# Patient Record
Sex: Male | Born: 1955 | Race: White | Hispanic: No | Marital: Single | Smoking: Former smoker
Health system: Southern US, Community
[De-identification: ages and names within clinical notes are randomized; demographics above are authoritative.]

## PROBLEM LIST (undated history)

## (undated) DIAGNOSIS — E119 Type 2 diabetes mellitus without complications: Secondary | ICD-10-CM

## (undated) DIAGNOSIS — I4891 Unspecified atrial fibrillation: Secondary | ICD-10-CM

## (undated) DIAGNOSIS — I10 Essential (primary) hypertension: Secondary | ICD-10-CM

## (undated) HISTORY — PX: CATARACT EXTRACTION: SUR2

---

## 2019-11-11 ENCOUNTER — Other Ambulatory Visit: Payer: Self-pay | Admitting: Orthopedic Surgery

## 2019-11-11 ENCOUNTER — Ambulatory Visit
Admission: RE | Admit: 2019-11-11 | Discharge: 2019-11-11 | Disposition: A | Discharge status: Home / Self Care | Payer: Medicare Other | Source: Ambulatory Visit | Attending: Orthopedic Surgery | Admitting: Orthopedic Surgery

## 2019-11-11 DIAGNOSIS — M25562 Pain in left knee: Secondary | ICD-10-CM

## 2021-02-01 IMAGING — MR MR KNEE*L* W/O CM
4 of 6 series · 23 of 40 positions shown · non-contrast
Comparison: None.

CLINICAL DATA: Diffuse knee pain with tightness for 1 year. No
acute injury or prior relevant surgery.

EXAM:
MRI OF THE LEFT KNEE WITHOUT CONTRAST
TECHNIQUE: Multiplanar, multisequence MR imaging of the knee was performed. No
intravenous contrast was administered.

[Series 4: T2 fat-sat · coronal · 4.0mm · 0.59mm/px · 6 of 26 slices shown (1 of 2)]
[im 1/26]
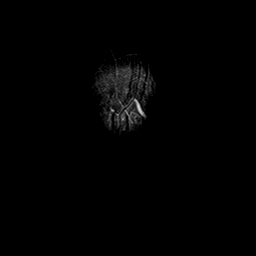
[im 6/26]
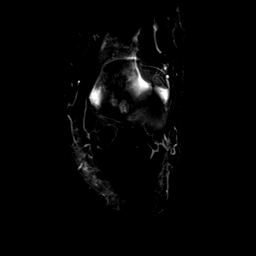
[im 11/26]
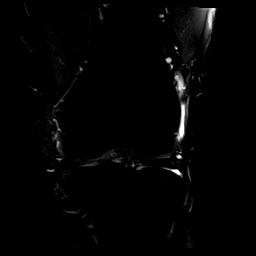
[im 16/26]
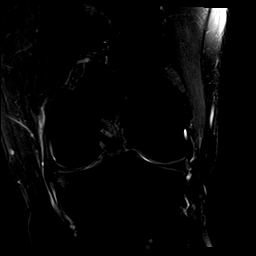
[im 21/26]
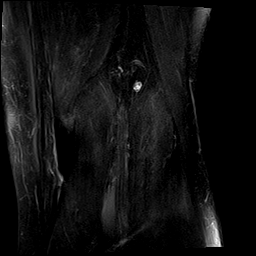
[im 26/26]
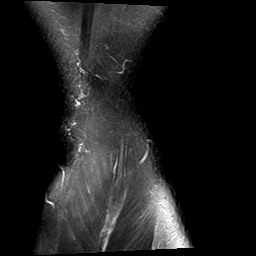

[Series 5: T1 · coronal · 4.0mm · 0.29mm/px · 3 of 26 slices shown]
[im 6/26]
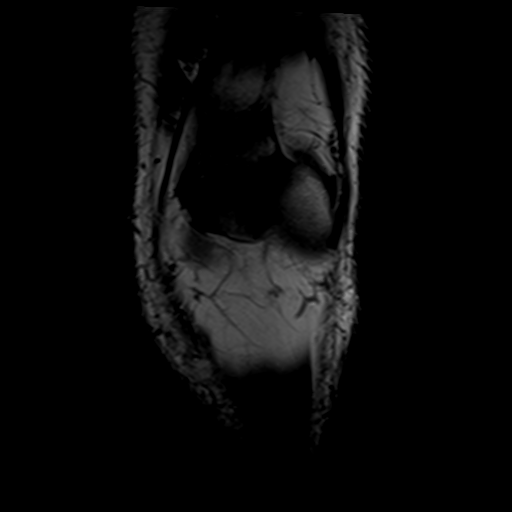
[im 16/26]
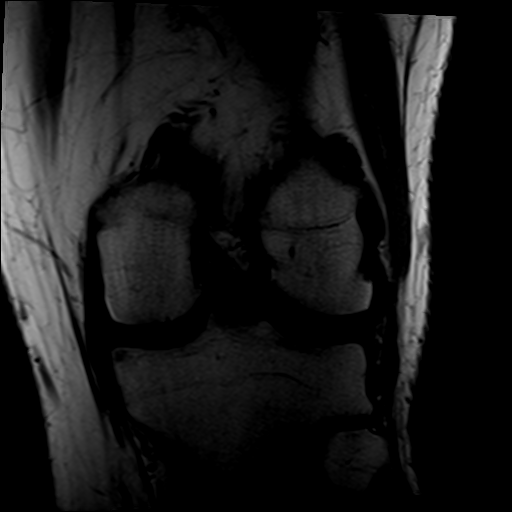
[im 26/26]
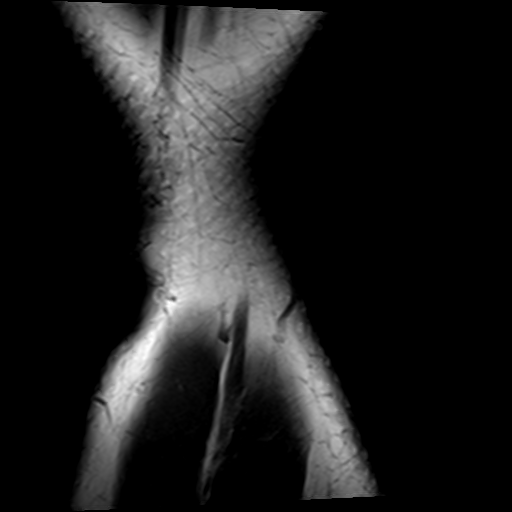

[Series 7: T2 fat-sat · sagittal · 3.0mm · 0.29mm/px · 7 of 30 slices shown (2 of 2)]
[im 1/30]
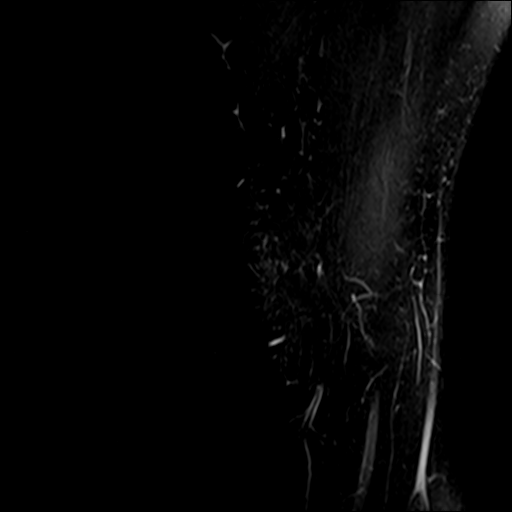
[im 5/30]
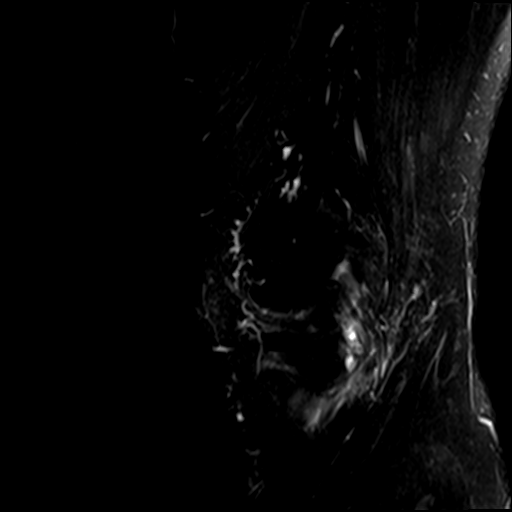
[im 10/30]
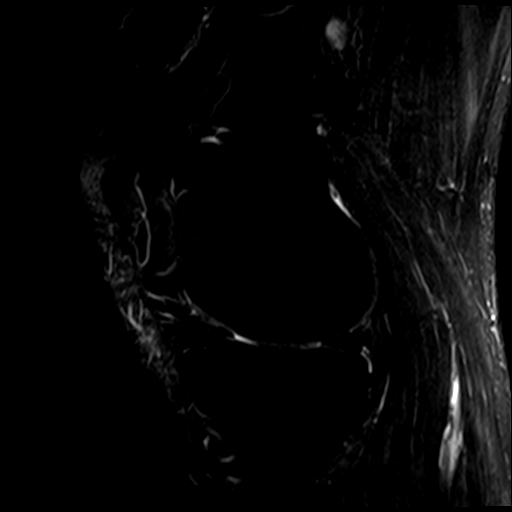
[im 15/30]
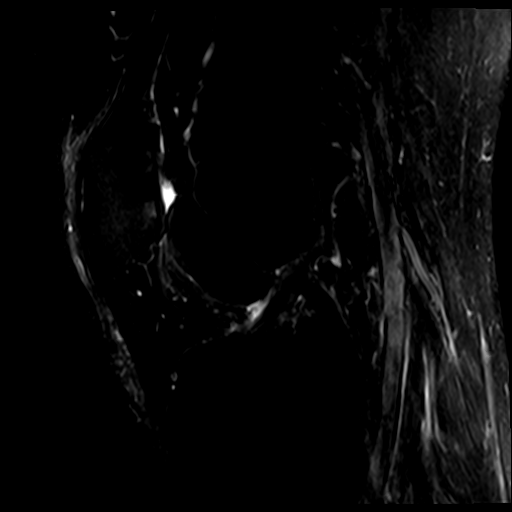
[im 20/30]
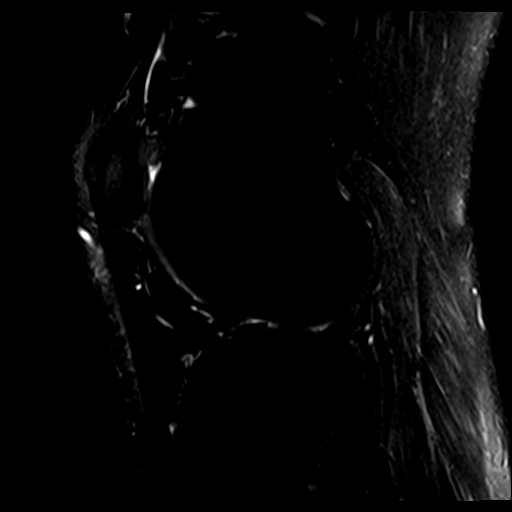
[im 25/30]
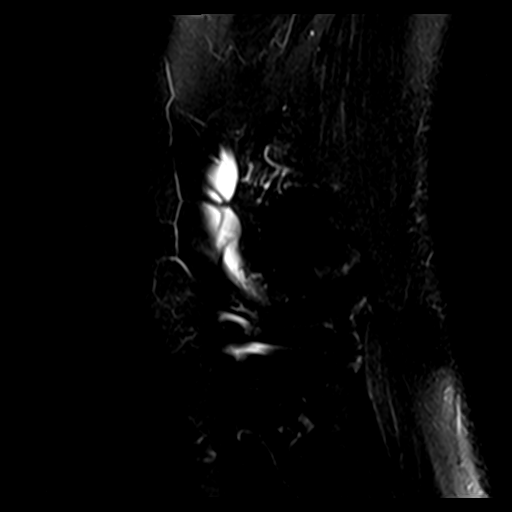
[im 30/30]
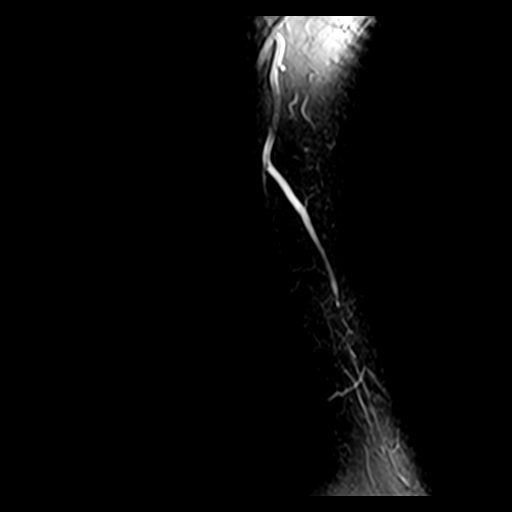

[Series 8: PD fat-sat · sagittal · 3.0mm · 0.29mm/px · 7 of 30 slices shown]
[im 1/30]
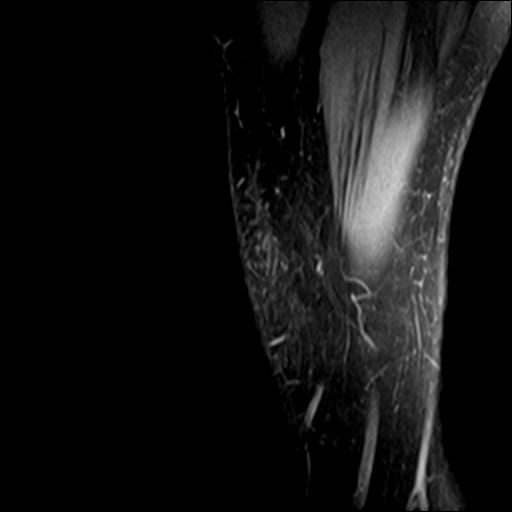
[im 5/30]
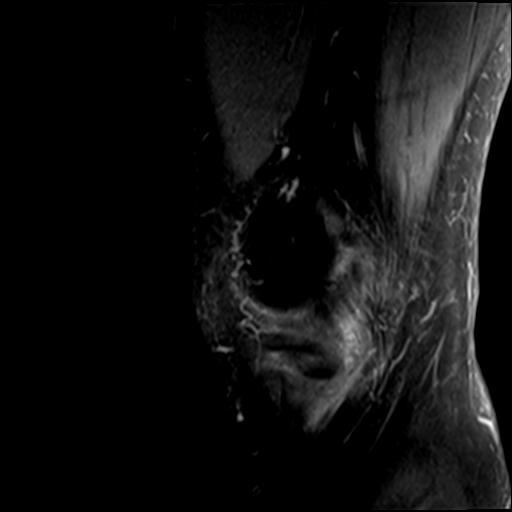
[im 10/30]
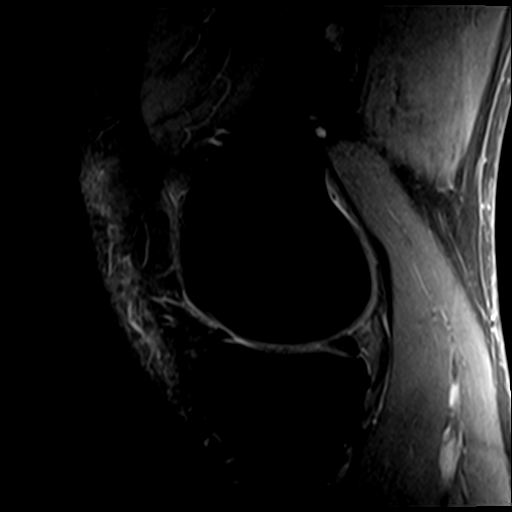
[im 15/30]
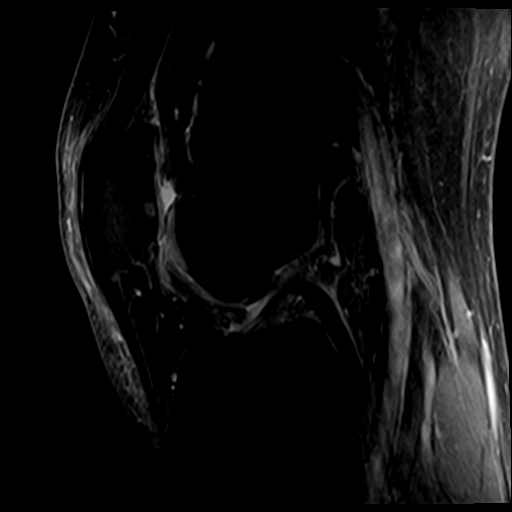
[im 20/30]
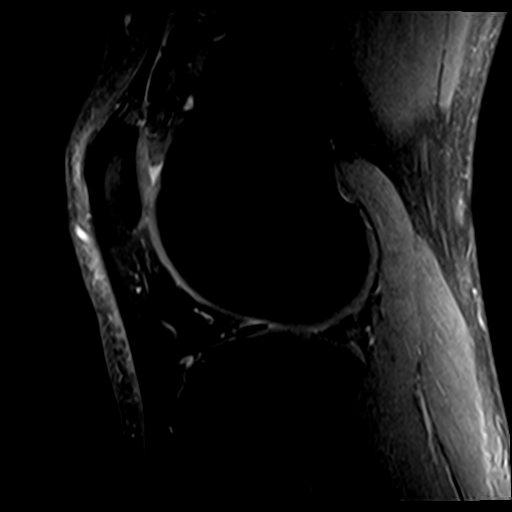
[im 25/30]
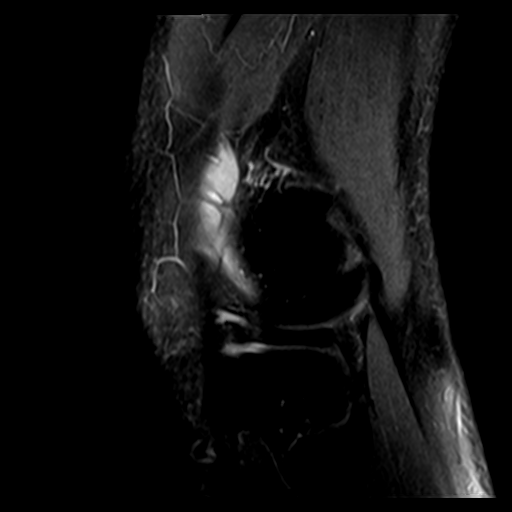
[im 30/30]
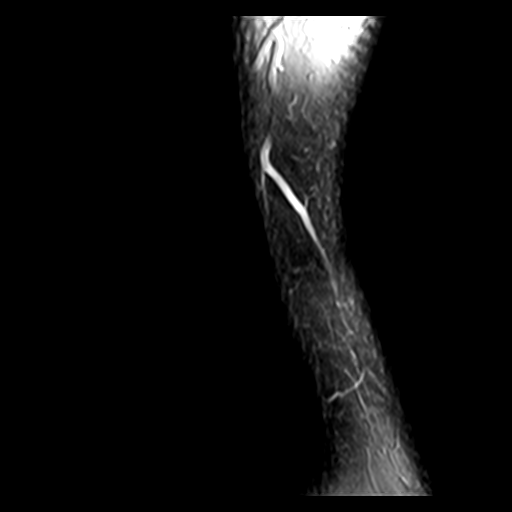

[23 of 40 positions shown; findings below may reference images not displayed]

FINDINGS: MENISCI

Medial meniscus: There is an irregular degenerative tear of the
posterior horn and body. A small flap fragment is peripherally
displaced into the meniscotibial recess, best seen on the coronal
images. The meniscal root is intact, and no centrally displaced
meniscal fragments are seen. There is a septated cyst
posteromedially adjacent to the medial meniscus which measures up to
15 mm on coronal image [DATE], potentially a parameniscal cyst.

Lateral meniscus:  Intact with normal morphology.

LIGAMENTS

Cruciates:  Intact.

Collaterals:  Intact.

CARTILAGE

Patellofemoral: Moderate patellofemoral degenerative changes with
chondral thinning, surface irregularity and subchondral cyst
formation at the patellar apex. The trochlear cartilage is
relatively preserved.

Medial: Mild chondral thinning and surface irregularity without
full-thickness defect. There is mild subchondral cyst formation
peripherally in the medial tibial plateau.

Lateral:  Preserved.

MISCELLANEOUS

Joint:  No significant joint effusion.

Popliteal Fossa:  Unremarkable. No significant Baker's cyst.

Extensor Mechanism:  Intact.

Bones:  No acute or significant extra-articular osseous findings.

Other: No other significant periarticular soft tissue findings.
IMPRESSION: 1. Degenerative tear of the posterior horn and body of the medial
meniscus with small flap fragment peripherally displaced into the
meniscotibial recess.
2. Septated cyst posteromedially adjacent to the medial meniscus,
potentially a parameniscal cyst.
3. Moderate patellofemoral and mild medial compartment degenerative
changes. No acute osseous findings.
4. The lateral meniscus, cruciate and collateral ligaments are
intact.

## 2022-02-05 ENCOUNTER — Encounter (HOSPITAL_COMMUNITY): Payer: Self-pay

## 2022-02-05 ENCOUNTER — Emergency Department (HOSPITAL_BASED_OUTPATIENT_CLINIC_OR_DEPARTMENT_OTHER): Payer: Medicare Other

## 2022-02-05 ENCOUNTER — Encounter (HOSPITAL_BASED_OUTPATIENT_CLINIC_OR_DEPARTMENT_OTHER): Payer: Self-pay | Admitting: Emergency Medicine

## 2022-02-05 ENCOUNTER — Inpatient Hospital Stay (HOSPITAL_BASED_OUTPATIENT_CLINIC_OR_DEPARTMENT_OTHER)
Admission: EM | Admit: 2022-02-05 | Discharge: 2022-02-08 | DRG: 871 | Disposition: A | Discharge status: Home / Self Care | Payer: Medicare Other | Attending: Internal Medicine | Admitting: Internal Medicine

## 2022-02-05 ENCOUNTER — Inpatient Hospital Stay (HOSPITAL_COMMUNITY): Payer: Medicare Other

## 2022-02-05 DIAGNOSIS — G4700 Insomnia, unspecified: Secondary | ICD-10-CM | POA: Diagnosis present

## 2022-02-05 DIAGNOSIS — Z20822 Contact with and (suspected) exposure to covid-19: Secondary | ICD-10-CM | POA: Diagnosis present

## 2022-02-05 DIAGNOSIS — Z7901 Long term (current) use of anticoagulants: Secondary | ICD-10-CM

## 2022-02-05 DIAGNOSIS — A419 Sepsis, unspecified organism: Secondary | ICD-10-CM | POA: Diagnosis present

## 2022-02-05 DIAGNOSIS — J189 Pneumonia, unspecified organism: Secondary | ICD-10-CM | POA: Diagnosis present

## 2022-02-05 DIAGNOSIS — E785 Hyperlipidemia, unspecified: Secondary | ICD-10-CM | POA: Diagnosis present

## 2022-02-05 DIAGNOSIS — Z7984 Long term (current) use of oral hypoglycemic drugs: Secondary | ICD-10-CM | POA: Diagnosis not present

## 2022-02-05 DIAGNOSIS — Z87891 Personal history of nicotine dependence: Secondary | ICD-10-CM | POA: Diagnosis not present

## 2022-02-05 DIAGNOSIS — I480 Paroxysmal atrial fibrillation: Secondary | ICD-10-CM | POA: Diagnosis present

## 2022-02-05 DIAGNOSIS — Z79899 Other long term (current) drug therapy: Secondary | ICD-10-CM | POA: Diagnosis not present

## 2022-02-05 DIAGNOSIS — E119 Type 2 diabetes mellitus without complications: Secondary | ICD-10-CM | POA: Diagnosis present

## 2022-02-05 DIAGNOSIS — E8770 Fluid overload, unspecified: Secondary | ICD-10-CM | POA: Diagnosis present

## 2022-02-05 DIAGNOSIS — J9601 Acute respiratory failure with hypoxia: Secondary | ICD-10-CM | POA: Diagnosis present

## 2022-02-05 DIAGNOSIS — I509 Heart failure, unspecified: Secondary | ICD-10-CM

## 2022-02-05 DIAGNOSIS — I4891 Unspecified atrial fibrillation: Secondary | ICD-10-CM | POA: Diagnosis not present

## 2022-02-05 DIAGNOSIS — Z23 Encounter for immunization: Secondary | ICD-10-CM | POA: Diagnosis present

## 2022-02-05 DIAGNOSIS — I10 Essential (primary) hypertension: Secondary | ICD-10-CM | POA: Diagnosis present

## 2022-02-05 DIAGNOSIS — R0902 Hypoxemia: Secondary | ICD-10-CM | POA: Diagnosis present

## 2022-02-05 HISTORY — DX: Unspecified atrial fibrillation: I48.91

## 2022-02-05 HISTORY — DX: Essential (primary) hypertension: I10

## 2022-02-05 HISTORY — DX: Type 2 diabetes mellitus without complications: E11.9

## 2022-02-05 LAB — ECHOCARDIOGRAM COMPLETE
AR max vel: 2.5 cm2
AV Area VTI: 2.47 cm2
AV Area mean vel: 2.44 cm2
AV Mean grad: 3 mmHg
AV Peak grad: 5.8 mmHg
Ao pk vel: 1.2 m/s
Height: 72 in
S' Lateral: 3.4 cm
Weight: 3280 oz

## 2022-02-05 LAB — GLUCOSE, CAPILLARY
Glucose-Capillary: 165 mg/dL — ABNORMAL HIGH (ref 70–99)
Glucose-Capillary: 174 mg/dL — ABNORMAL HIGH (ref 70–99)
Glucose-Capillary: 193 mg/dL — ABNORMAL HIGH (ref 70–99)
Glucose-Capillary: 196 mg/dL — ABNORMAL HIGH (ref 70–99)

## 2022-02-05 LAB — I-STAT VENOUS BLOOD GAS, ED
Acid-Base Excess: 3 mmol/L — ABNORMAL HIGH (ref 0.0–2.0)
Bicarbonate: 29.7 mmol/L — ABNORMAL HIGH (ref 20.0–28.0)
Calcium, Ion: 1.15 mmol/L (ref 1.15–1.40)
HCT: 43 % (ref 39.0–52.0)
Hemoglobin: 14.6 g/dL (ref 13.0–17.0)
O2 Saturation: 28 %
Patient temperature: 102.6
Potassium: 4 mmol/L (ref 3.5–5.1)
Sodium: 140 mmol/L (ref 135–145)
TCO2: 31 mmol/L (ref 22–32)
pCO2, Ven: 57.1 mmHg (ref 44–60)
pH, Ven: 7.335 (ref 7.25–7.43)
pO2, Ven: 23 mmHg — CL (ref 32–45)

## 2022-02-05 LAB — CBC WITH DIFFERENTIAL/PLATELET
Abs Immature Granulocytes: 0.07 10*3/uL (ref 0.00–0.07)
Basophils Absolute: 0.1 10*3/uL (ref 0.0–0.1)
Basophils Relative: 0 %
Eosinophils Absolute: 0.1 10*3/uL (ref 0.0–0.5)
Eosinophils Relative: 1 %
HCT: 44.4 % (ref 39.0–52.0)
Hemoglobin: 14.8 g/dL (ref 13.0–17.0)
Immature Granulocytes: 0 %
Lymphocytes Relative: 4 %
Lymphs Abs: 0.6 10*3/uL — ABNORMAL LOW (ref 0.7–4.0)
MCH: 31 pg (ref 26.0–34.0)
MCHC: 33.3 g/dL (ref 30.0–36.0)
MCV: 93.1 fL (ref 80.0–100.0)
Monocytes Absolute: 0.8 10*3/uL (ref 0.1–1.0)
Monocytes Relative: 5 %
Neutro Abs: 14.2 10*3/uL — ABNORMAL HIGH (ref 1.7–7.7)
Neutrophils Relative %: 90 %
Platelets: 231 10*3/uL (ref 150–400)
RBC: 4.77 MIL/uL (ref 4.22–5.81)
RDW: 13.6 % (ref 11.5–15.5)
WBC: 15.8 10*3/uL — ABNORMAL HIGH (ref 4.0–10.5)
nRBC: 0 % (ref 0.0–0.2)

## 2022-02-05 LAB — LACTIC ACID, PLASMA: Lactic Acid, Venous: 1.5 mmol/L (ref 0.5–1.9)

## 2022-02-05 LAB — RESPIRATORY PANEL BY PCR

## 2022-02-05 LAB — HIV ANTIBODY (ROUTINE TESTING W REFLEX): HIV Screen 4th Generation wRfx: NONREACTIVE

## 2022-02-05 LAB — BASIC METABOLIC PANEL
Anion gap: 7 (ref 5–15)
BUN: 19 mg/dL (ref 8–23)
CO2: 27 mmol/L (ref 22–32)
Calcium: 8.5 mg/dL — ABNORMAL LOW (ref 8.9–10.3)
Chloride: 106 mmol/L (ref 98–111)
Creatinine, Ser: 1.29 mg/dL — ABNORMAL HIGH (ref 0.61–1.24)
GFR, Estimated: >60 mL/min (ref >60)
Glucose, Bld: 206 mg/dL — ABNORMAL HIGH (ref 70–99)
Potassium: 4.2 mmol/L (ref 3.5–5.1)
Sodium: 140 mmol/L (ref 135–145)

## 2022-02-05 LAB — STREP PNEUMONIAE URINARY ANTIGEN: Strep Pneumo Urinary Antigen: NEGATIVE

## 2022-02-05 LAB — SARS CORONAVIRUS 2 BY RT PCR: SARS Coronavirus 2 by RT PCR: NEGATIVE

## 2022-02-05 LAB — TROPONIN I (HIGH SENSITIVITY)
Troponin I (High Sensitivity): 9 ng/L (ref <18)
Troponin I (High Sensitivity): 17 ng/L (ref <18)
Troponin I (High Sensitivity): 14 ng/L (ref <18)

## 2022-02-05 LAB — D-DIMER, QUANTITATIVE: D-Dimer, Quant: 0.3 ug/mL-FEU (ref 0.00–0.50)

## 2022-02-05 LAB — PROCALCITONIN: Procalcitonin: <0.1 ng/mL

## 2022-02-05 LAB — MRSA NEXT GEN BY PCR, NASAL: MRSA by PCR Next Gen: NOT DETECTED

## 2022-02-05 LAB — BRAIN NATRIURETIC PEPTIDE: B Natriuretic Peptide: 323.1 pg/mL — ABNORMAL HIGH (ref 0.0–100.0)

## 2022-02-05 MED ORDER — SODIUM CHLORIDE 0.9 % IV SOLN
2.0000 g | INTRAVENOUS | Status: DC
  Administered 2022-02-06 – 2022-02-08 (×3): 2 g via INTRAVENOUS
  Filled 2022-02-05 (×3): qty 20

## 2022-02-05 MED ORDER — HEPARIN SODIUM (PORCINE) 5000 UNIT/ML IJ SOLN: 5000.0000 [IU] | Freq: Three times a day (TID) | INTRAMUSCULAR | Status: DC

## 2022-02-05 MED ORDER — POLYETHYLENE GLYCOL 3350 17 G PO PACK: 17.0000 g | PACK | Freq: Every day | ORAL | Status: DC | PRN

## 2022-02-05 MED ORDER — DOCUSATE SODIUM 100 MG PO CAPS: 100.0000 mg | ORAL_CAPSULE | Freq: Two times a day (BID) | ORAL | Status: DC | PRN

## 2022-02-05 MED ORDER — SODIUM CHLORIDE 0.9 % IV SOLN
500.0000 mg | INTRAVENOUS | Status: DC
  Administered 2022-02-06 – 2022-02-08 (×3): 500 mg via INTRAVENOUS
  Filled 2022-02-05 (×3): qty 5

## 2022-02-05 MED ORDER — DILTIAZEM HCL-DEXTROSE 125-5 MG/125ML-% IV SOLN (PREMIX)
INTRAVENOUS | Status: AC
  Administered 2022-02-05: 20 mg via INTRAVENOUS
  Filled 2022-02-05: qty 125

## 2022-02-05 MED ORDER — CARVEDILOL 12.5 MG PO TABS
12.5000 mg | ORAL_TABLET | Freq: Two times a day (BID) | ORAL | Status: DC
  Administered 2022-02-05 – 2022-02-08 (×7): 12.5 mg via ORAL
  Filled 2022-02-05 (×7): qty 1

## 2022-02-05 MED ORDER — ACETAMINOPHEN 325 MG PO TABS
650.0000 mg | ORAL_TABLET | Freq: Four times a day (QID) | ORAL | Status: DC | PRN
  Administered 2022-02-05 – 2022-02-08 (×8): 650 mg via ORAL
  Filled 2022-02-05 (×8): qty 2

## 2022-02-05 MED ORDER — DILTIAZEM HCL-DEXTROSE 125-5 MG/125ML-% IV SOLN (PREMIX)
5.0000 mg/h | INTRAVENOUS | Status: DC
  Administered 2022-02-05: 5 mg/h via INTRAVENOUS

## 2022-02-05 MED ORDER — ATORVASTATIN CALCIUM 40 MG PO TABS
40.0000 mg | ORAL_TABLET | Freq: Every day | ORAL | Status: DC
  Administered 2022-02-05 – 2022-02-08 (×4): 40 mg via ORAL
  Filled 2022-02-05 (×4): qty 1

## 2022-02-05 MED ORDER — ONDANSETRON HCL 4 MG/2ML IJ SOLN
4.0000 mg | Freq: Once | INTRAMUSCULAR | Status: AC
  Administered 2022-02-05: 4 mg via INTRAVENOUS
  Filled 2022-02-05: qty 2

## 2022-02-05 MED ORDER — INSULIN ASPART 100 UNIT/ML IJ SOLN
0.0000 [IU] | Freq: Three times a day (TID) | INTRAMUSCULAR | Status: DC
  Administered 2022-02-05 (×3): 3 [IU] via SUBCUTANEOUS
  Administered 2022-02-06: 2 [IU] via SUBCUTANEOUS
  Administered 2022-02-06 (×3): 3 [IU] via SUBCUTANEOUS
  Administered 2022-02-07 (×2): 2 [IU] via SUBCUTANEOUS
  Administered 2022-02-07 (×2): 3 [IU] via SUBCUTANEOUS
  Administered 2022-02-08: 2 [IU] via SUBCUTANEOUS
  Administered 2022-02-08: 5 [IU] via SUBCUTANEOUS

## 2022-02-05 MED ORDER — APIXABAN 5 MG PO TABS
5.0000 mg | ORAL_TABLET | Freq: Two times a day (BID) | ORAL | Status: DC
  Administered 2022-02-05 – 2022-02-08 (×7): 5 mg via ORAL
  Filled 2022-02-05 (×7): qty 1

## 2022-02-05 MED ORDER — SODIUM CHLORIDE 0.9 % IV SOLN
500.0000 mg | Freq: Once | INTRAVENOUS | Status: AC
  Administered 2022-02-05: 500 mg via INTRAVENOUS
  Filled 2022-02-05: qty 5

## 2022-02-05 MED ORDER — ACETAMINOPHEN 500 MG PO TABS
1000.0000 mg | ORAL_TABLET | Freq: Once | ORAL | Status: AC
  Administered 2022-02-05: 1000 mg via ORAL
  Filled 2022-02-05: qty 2

## 2022-02-05 MED ORDER — GABAPENTIN 300 MG PO CAPS
600.0000 mg | ORAL_CAPSULE | Freq: Every day | ORAL | Status: DC
  Administered 2022-02-05 – 2022-02-07 (×3): 600 mg via ORAL
  Filled 2022-02-05 (×3): qty 2

## 2022-02-05 MED ORDER — ORAL CARE MOUTH RINSE: 15.0000 mL | OROMUCOSAL | Status: DC | PRN

## 2022-02-05 MED ORDER — IBUPROFEN 400 MG PO TABS
400.0000 mg | ORAL_TABLET | Freq: Once | ORAL | Status: AC
  Administered 2022-02-05: 400 mg via ORAL
  Filled 2022-02-05: qty 1

## 2022-02-05 MED ORDER — SODIUM CHLORIDE 0.9 % IV SOLN
1.0000 g | Freq: Once | INTRAVENOUS | Status: AC
  Administered 2022-02-05: 1 g via INTRAVENOUS
  Filled 2022-02-05: qty 10

## 2022-02-05 MED ORDER — DILTIAZEM LOAD VIA INFUSION
20.0000 mg | Freq: Once | INTRAVENOUS | Status: AC
  Filled 2022-02-05: qty 20

## 2022-02-05 MED ORDER — LACTATED RINGERS IV BOLUS
500.0000 mL | Freq: Once | INTRAVENOUS | Status: AC
  Administered 2022-02-05: 500 mL via INTRAVENOUS

## 2022-02-05 MED ORDER — LACTATED RINGERS IV BOLUS
1000.0000 mL | Freq: Once | INTRAVENOUS | Status: AC
  Administered 2022-02-05: 1000 mL via INTRAVENOUS

## 2022-02-05 MED ORDER — ONDANSETRON HCL 4 MG/2ML IJ SOLN: 4.0000 mg | Freq: Four times a day (QID) | INTRAMUSCULAR | Status: DC | PRN

## 2022-02-05 NOTE — ED Notes (Signed)
Attempted to call report, receiving nurse in an isolation room, will call back shortly.

## 2022-02-05 NOTE — ED Notes (Signed)
[facility] at bedside for transport. 

## 2022-02-05 NOTE — ED Notes (Signed)
Patient presented to room 6 with oxygen saturations of 76%. Patient placed on NRB @ 15L. And 15L [place] [facility]. Patient oxygen saturations increased to 88%, RR 40. Patient placed on 30L HHF [place] @ 100%. With a NRB. Patient oxygen saturations increased to 92%. Patient very nervous, shaking. Patient currently on 35L HHF @100 %.  RR 22. Will continue to monitor and make changes as necessary. Patient tolerating well.

## 2022-02-05 NOTE — ED Triage Notes (Signed)
Pt states he started with sore throat and cold sxs yesterday and this morning he feels short of breath  Pt has a cough today and chills

## 2022-02-05 NOTE — ED Provider Notes (Signed)
[facility] Provider Note: Georgena Spurling, MD, FACEP  CSN: [id number] MRN: [id number] ARRIVAL: 02/05/22 at [place]: [facility]   HISTORY OF PRESENT ILLNESS  02/05/22 6:07 AM Peter Robinson is a 66 y.o. male with a history of atrial fibrillation on Eliquis.  He developed a sore throat 2 days ago which resolved yesterday he developed chest congestion.  This morning he developed shortness of breath and cough.  His shortness of breath is moderate to severe and he noted expiratory rhonchorous sounds.  On arrival here he was tachypneic with an oxygen saturation of 76% on room air and respiratory therapy placed him on high flow nasal cannula and his oxygen saturation improved to 90%.  He is noted to be tachycardic with a pulse rate as high as 140, A-fib with RVR on the monitor.  He has not had a fever but does feel chilled.   Past Medical History:  Diagnosis Date   Atrial fibrillation ([place])    Diabetes mellitus without complication ([place])    Hypertension     Past Surgical History:  Procedure Laterality Date   CATARACT EXTRACTION Bilateral     No family history on file.  Social History   Tobacco Use   Smoking status: Former    Types: Cigarettes   Smokeless tobacco: Never  Vaping Use   Vaping Use: Never used  Substance Use Topics   Alcohol use: Yes    Comment: occ   Drug use: Never    Prior to Admission medications   Medication Sig Start Date End Date Taking? Authorizing Provider  amLODipine (NORVASC) 2.5 MG tablet Take 2.5 mg by mouth daily. 12/28/21  Yes [provider]  atorvastatin (LIPITOR) 40 MG tablet Take 40 mg by mouth daily. 11/21/21  Yes [provider]  carvedilol (COREG) 12.5 MG tablet Take 12.5 mg by mouth 2 (two) times daily. 11/21/21  Yes [provider]  cyclobenzaprine (FLEXERIL) 10 MG tablet Take 5-10 mg by mouth 3 (three) times daily as needed for muscle spasms. 10/10/21  Yes  [provider]  doxazosin (CARDURA) 2 MG tablet Take 2 mg by mouth daily. 11/21/21  Yes [provider]  ELIQUIS 5 MG TABS tablet Take 5 mg by mouth 2 (two) times daily. 08/10/21  Yes [provider]  furosemide (LASIX) 20 MG tablet Take 20 mg by mouth daily. 01/23/22  Yes [provider]  gabapentin (NEURONTIN) 300 MG capsule Take 900 mg by mouth 2 (two) times daily. 01/12/22  Yes [provider]  glipiZIDE (GLUCOTROL XL) 10 MG 24 hr tablet Take 20 mg by mouth daily with breakfast. 12/28/21  Yes [provider]  losartan (COZAAR) 100 MG tablet Take 100 mg by mouth daily. 11/21/21  Yes [provider]  metFORMIN (GLUCOPHAGE-XR) 500 MG 24 hr tablet Take 500 mg by mouth daily. 11/30/21  Yes [provider]  predniSONE (DELTASONE) 20 MG tablet Take 20 mg by mouth daily as needed. 01/11/22  Yes [provider]    Allergies Patient has no known allergies.   REVIEW OF SYSTEMS  Negative except as noted here or in the History of Present Illness.   PHYSICAL EXAMINATION  Initial Vital Signs Blood pressure (!) 173/115, pulse (!) 140, temperature 98.1 F (36.7 C), temperature source Oral, resp. rate (!) 24, height 6' (1.829 m), weight 93 kg, SpO2 (!) 76 %.  Examination General: Well-developed, well-nourished male in no acute distress; appearance consistent with  age of record HENT: normocephalic; atraumatic Eyes: Normal appearance Neck: supple Heart: Irregular rhythm; tachycardia; having difficulty speaking in full sentences Lungs: Mild coarse sounds bilaterally; tachypnea Abdomen: soft; nondistended; nontender; bowel sounds present Extremities: No deformity; full range of motion; pulses normal Neurologic: Awake, alert and oriented; motor function intact in all extremities and symmetric; no facial droop Skin: Warm and dry Psychiatric: Anxious   RESULTS  Summary of this visit's results, reviewed and interpreted by  myself:   EKG Interpretation  Date/Time:    Ventricular Rate:    PR Interval:    QRS Duration:   QT Interval:    QTC Calculation:   R Axis:     Text Interpretation:         Laboratory Studies: Results for orders placed or performed during the hospital encounter of 02/05/22 (from the past 24 hour(s))  SARS Coronavirus 2 by RT PCR (hospital order, performed in [facility] hospital lab) *cepheid single result test* Anterior Nasal Swab     Status: None   Collection Time: 02/05/22  6:00 AM   Specimen: Anterior Nasal Swab  Result Value Ref Range   SARS Coronavirus 2 by RT PCR NEGATIVE NEGATIVE  CBC with Differential     Status: Abnormal   Collection Time: 02/05/22  6:15 AM  Result Value Ref Range   WBC 15.8 (H) 4.0 - 10.5 K/uL   RBC 4.77 4.22 - 5.81 MIL/uL   Hemoglobin 14.8 13.0 - 17.0 g/dL   HCT 44.4 39.0 - 52.0 %   MCV 93.1 80.0 - 100.0 fL   MCH 31.0 26.0 - 34.0 pg   MCHC 33.3 30.0 - 36.0 g/dL   RDW 13.6 11.5 - 15.5 %   Platelets 231 150 - 400 K/uL   nRBC 0.0 0.0 - 0.2 %   Neutrophils Relative % 90 %   Neutro Abs 14.2 (H) 1.7 - 7.7 K/uL   Lymphocytes Relative 4 %   Lymphs Abs 0.6 (L) 0.7 - 4.0 K/uL   Monocytes Relative 5 %   Monocytes Absolute 0.8 0.1 - 1.0 K/uL   Eosinophils Relative 1 %   Eosinophils Absolute 0.1 0.0 - 0.5 K/uL   Basophils Relative 0 %   Basophils Absolute 0.1 0.0 - 0.1 K/uL   Immature Granulocytes 0 %   Abs Immature Granulocytes 0.07 0.00 - 0.07 K/uL  Basic metabolic panel     Status: Abnormal   Collection Time: 02/05/22  6:15 AM  Result Value Ref Range   Sodium 140 135 - 145 mmol/L   Potassium 4.2 3.5 - 5.1 mmol/L   Chloride 106 98 - 111 mmol/L   CO2 27 22 - 32 mmol/L   Glucose, Bld 206 (H) 70 - 99 mg/dL   BUN 19 8 - 23 mg/dL   Creatinine, Ser 1.29 (H) 0.61 - 1.24 mg/dL   Calcium 8.5 (L) 8.9 - 10.3 mg/dL   GFR, Estimated >60 >60 mL/min   Anion gap 7 5 - 15  Brain natriuretic peptide     Status: Abnormal   Collection Time: 02/05/22  6:15  AM  Result Value Ref Range   B Natriuretic Peptide 323.1 (H) 0.0 - 100.0 pg/mL  Lactic acid, plasma     Status: None   Collection Time: 02/05/22  6:15 AM  Result Value Ref Range   Lactic Acid, Venous 1.5 0.5 - 1.9 mmol/L  Procalcitonin - Baseline     Status: None   Collection Time: 02/05/22  6:36 AM  Result Value Ref Range  Procalcitonin <0.10 ng/mL  D-dimer, quantitative     Status: None   Collection Time: 02/05/22  6:36 AM  Result Value Ref Range   D-Dimer, Quant 0.30 0.00 - 0.50 ug/mL-FEU  Respiratory (~20 pathogens) panel by PCR     Status: None   Collection Time: 02/05/22  7:06 AM   Specimen: Nasopharyngeal Swab; Respiratory  Result Value Ref Range   Adenovirus NOT DETECTED NOT DETECTED   Coronavirus 229E NOT DETECTED NOT DETECTED   Coronavirus HKU1 NOT DETECTED NOT DETECTED   Coronavirus NL63 NOT DETECTED NOT DETECTED   Coronavirus OC43 NOT DETECTED NOT DETECTED   Metapneumovirus NOT DETECTED NOT DETECTED   Rhinovirus / Enterovirus NOT DETECTED NOT DETECTED   Influenza A NOT DETECTED NOT DETECTED   Influenza B NOT DETECTED NOT DETECTED   Parainfluenza Virus 1 NOT DETECTED NOT DETECTED   Parainfluenza Virus 2 NOT DETECTED NOT DETECTED   Parainfluenza Virus 3 NOT DETECTED NOT DETECTED   Parainfluenza Virus 4 NOT DETECTED NOT DETECTED   Respiratory Syncytial Virus NOT DETECTED NOT DETECTED   Bordetella pertussis NOT DETECTED NOT DETECTED   Bordetella Parapertussis NOT DETECTED NOT DETECTED   Chlamydophila pneumoniae NOT DETECTED NOT DETECTED   Mycoplasma pneumoniae NOT DETECTED NOT DETECTED  I-Stat venous blood gas, ([facility] ED only)     Status: Abnormal   Collection Time: 02/05/22  7:21 AM  Result Value Ref Range   pH, Ven 7.335 7.25 - 7.43   pCO2, Ven 57.1 44 - 60 mmHg   pO2, Ven 23 (LL) 32 - 45 mmHg   Bicarbonate 29.7 (H) 20.0 - 28.0 mmol/L   TCO2 31 22 - 32 mmol/L   O2 Saturation 28 %   Acid-Base Excess 3.0 (H) 0.0 - 2.0 mmol/L   Sodium 140 135 - 145 mmol/L    Potassium 4.0 3.5 - 5.1 mmol/L   Calcium, Ion 1.15 1.15 - 1.40 mmol/L   HCT 43.0 39.0 - 52.0 %   Hemoglobin 14.6 13.0 - 17.0 g/dL   Patient temperature 102.6 F    Collection site IV start    Drawn by Operator    Sample type VENOUS    Comment NOTIFIED PHYSICIAN   Troponin I (High Sensitivity)     Status: None   Collection Time: 02/05/22  8:15 AM  Result Value Ref Range   Troponin I (High Sensitivity) 9 <18 ng/L  MRSA Next Gen by PCR, Nasal     Status: None   Collection Time: 02/05/22 10:11 AM   Specimen: Nasal Mucosa; Nasal Swab  Result Value Ref Range   MRSA by PCR Next Gen NOT DETECTED NOT DETECTED  Glucose, capillary     Status: Abnormal   Collection Time: 02/05/22 11:41 AM  Result Value Ref Range   Glucose-Capillary 165 (H) 70 - 99 mg/dL  HIV Antibody (routine testing w rflx)     Status: None   Collection Time: 02/05/22 11:45 AM  Result Value Ref Range   HIV Screen 4th Generation wRfx Non Reactive Non Reactive  Troponin I (High Sensitivity)     Status: None   Collection Time: 02/05/22 11:48 AM  Result Value Ref Range   Troponin I (High Sensitivity) 17 <18 ng/L  Troponin I (High Sensitivity)     Status: None   Collection Time: 02/05/22  2:17 PM  Result Value Ref Range   Troponin I (High Sensitivity) 14 <18 ng/L  Glucose, capillary     Status: Abnormal   Collection Time: 02/05/22  3:53 PM  Result Value Ref Range   Glucose-Capillary 174 (H) 70 - 99 mg/dL  Strep pneumoniae urinary antigen     Status: None   Collection Time: 02/05/22  4:45 PM  Result Value Ref Range   Strep Pneumo Urinary Antigen NEGATIVE NEGATIVE  Glucose, capillary     Status: Abnormal   Collection Time: 02/05/22  8:12 PM  Result Value Ref Range   Glucose-Capillary 193 (H) 70 - 99 mg/dL   Imaging Studies: ECHOCARDIOGRAM COMPLETE  Result Date: 02/05/2022    ECHOCARDIOGRAM REPORT   Patient Name:   Peter Robinson Date of Exam: 02/05/2022 Medical Rec #:  [id number]     Height:       72.0 in Accession #:     [id number]    Weight:       205.0 lb Date of Birth:  1956/04/24     BSA:          2.153 m Patient Age:    31 years      BP:           81/64 mmHg Patient Gender: M             HR:           77 bpm. Exam Location:  Inpatient Procedure: 2D Echo, Cardiac Doppler and Color Doppler Indications:    CHF  History:        Patient has no prior history of Echocardiogram examinations.                 Risk Factors:Hypertension, Diabetes and Dyslipidemia.  Sonographer:    Clayton Lefort RDCS (AE) Referring Phys: [place]  1. Left ventricular ejection fraction, by estimation, is 60 to 65%. The left ventricle has normal function. The left ventricle has no regional wall motion abnormalities. Left ventricular diastolic function could not be evaluated. There is the interventricular septum is flattened in systole and diastole, consistent with right ventricular pressure and volume overload.  2. Right ventricular systolic function is normal. The right ventricular size is moderately enlarged. There is mildly elevated pulmonary artery systolic pressure. The estimated right ventricular systolic pressure is 123456 mmHg.  3. The mitral valve is grossly normal. Trivial mitral valve regurgitation. No evidence of mitral stenosis.  4. The aortic valve is tricuspid. Aortic valve regurgitation is not visualized. No aortic stenosis is present.  5. The inferior vena cava is dilated in size with <50% respiratory variability, suggesting right atrial pressure of 15 mmHg. FINDINGS  Left Ventricle: Left ventricular ejection fraction, by estimation, is 60 to 65%. The left ventricle has normal function. The left ventricle has no regional wall motion abnormalities. The left ventricular internal cavity size was normal in size. There is  no left ventricular hypertrophy. The interventricular septum is flattened in systole and diastole, consistent with right ventricular pressure and volume overload. Left ventricular diastolic function could  not be evaluated due to atrial fibrillation. Left ventricular diastolic function could not be evaluated. Right Ventricle: The right ventricular size is moderately enlarged. No increase in right ventricular wall thickness. Right ventricular systolic function is normal. There is mildly elevated pulmonary artery systolic pressure. The tricuspid regurgitant velocity is 2.63 m/s, and with an assumed right atrial pressure of 15 mmHg, the estimated right ventricular systolic pressure is 123456 mmHg. Left Atrium: Left atrial size was normal in size. Right Atrium: Right atrial size was normal in size. Pericardium: There is no evidence of pericardial effusion. Mitral Valve: The mitral valve is grossly  normal. Trivial mitral valve regurgitation. No evidence of mitral valve stenosis. Tricuspid Valve: The tricuspid valve is grossly normal. Tricuspid valve regurgitation is mild . No evidence of tricuspid stenosis. Aortic Valve: The aortic valve is tricuspid. Aortic valve regurgitation is not visualized. No aortic stenosis is present. Aortic valve mean gradient measures 3.0 mmHg. Aortic valve peak gradient measures 5.8 mmHg. Aortic valve area, by VTI measures 2.47 cm. Pulmonic Valve: The pulmonic valve was grossly normal. Pulmonic valve regurgitation is trivial. No evidence of pulmonic stenosis. Aorta: The aortic root and ascending aorta are structurally normal, with no evidence of dilitation. Venous: The inferior vena cava is dilated in size with less than 50% respiratory variability, suggesting right atrial pressure of 15 mmHg. IAS/Shunts: The atrial septum is grossly normal.  LEFT VENTRICLE PLAX 2D LVIDd:         4.80 cm LVIDs:         3.40 cm LV PW:         1.20 cm LV IVS:        1.00 cm LVOT diam:     2.00 cm LV SV:         51 LV SV Index:   23 LVOT Area:     3.14 cm  RIGHT VENTRICLE RV Basal diam:  4.50 cm RV Mid diam:    5.00 cm RV S prime:     12.20 cm/s TAPSE (M-mode): 2.1 cm LEFT ATRIUM             Index        RIGHT  ATRIUM           Index LA diam:        4.70 cm 2.18 cm/m   RA Area:     25.40 cm LA Vol (A2C):   83.2 ml 38.64 ml/m  RA Volume:   76.30 ml  35.44 ml/m LA Vol (A4C):   57.8 ml 26.85 ml/m LA Biplane Vol: 70.8 ml 32.88 ml/m  AORTIC VALVE AV Area (Vmax):    2.50 cm AV Area (Vmean):   2.44 cm AV Area (VTI):     2.47 cm AV Vmax:           120.00 cm/s AV Vmean:          83.800 cm/s AV VTI:            0.205 m AV Peak Grad:      5.8 mmHg AV Mean Grad:      3.0 mmHg LVOT Vmax:         95.60 cm/s LVOT Vmean:        65.100 cm/s LVOT VTI:          0.161 m LVOT/AV VTI ratio: 0.79  AORTA Ao Root diam: 3.50 cm Ao Asc diam:  3.00 cm TRICUSPID VALVE TR Peak grad:   27.7 mmHg TR Vmax:        263.00 cm/s  SHUNTS Systemic VTI:  0.16 m Systemic Diam: 2.00 cm Eleonore Chiquito MD Electronically signed by Eleonore Chiquito MD Signature Date/Time: 02/05/2022/1:53:47 PM    Final    DG Chest Port 1 View  Result Date: 02/05/2022 CLINICAL DATA:  Shortness of breath and sore throat. EXAM: PORTABLE CHEST 1 VIEW COMPARISON:  None Available. FINDINGS: 0620 hours. Bilateral hilar fullness noted. Masslike consolidative opacity is identified in the left mid lung with alveolar opacity at the left base. No substantial pleural effusion. The cardiopericardial silhouette is within normal limits for size. The visualized bony structures  of the thorax are unremarkable. Telemetry leads overlie the chest. IMPRESSION: Masslike consolidative opacity in the left mid lung with alveolar opacity at the left base. While imaging features may reflect pneumonia, neoplasm is not excluded. Close follow-up recommended and consider CT chest to further evaluate. Electronically Signed   By: Kennith Center M.D.   On: 02/05/2022 06:59    ED COURSE and MDM  Nursing notes, initial and subsequent vitals signs, including pulse oximetry, reviewed and interpreted by myself.  Vitals:   02/05/22 2000 02/05/22 2100 02/05/22 2200 02/05/22 2230  BP: 136/88 134/84 (!) 134/92 (!)  141/90  Pulse: 94 94 94 98  Resp: 16 18 14 15   Temp: 98.1 F (36.7 C)     TempSrc: Oral     SpO2: 93% 92% 93% 92%  Weight:      Height:       Medications  diltiazem (CARDIZEM) 1 mg/mL load via infusion 20 mg (20 mg Intravenous Bolus from Bag 02/05/22 0623)    And  diltiazem (CARDIZEM) 125 mg in dextrose 5% 125 mL (1 mg/mL) infusion (0 mg/hr Intravenous Stopped 02/05/22 1321)  docusate sodium (COLACE) capsule 100 mg (has no administration in time range)  polyethylene glycol (MIRALAX / GLYCOLAX) packet 17 g (has no administration in time range)  acetaminophen (TYLENOL) tablet 650 mg (650 mg Oral Given 02/05/22 2009)  azithromycin (ZITHROMAX) 500 mg in sodium chloride 0.9 % 250 mL IVPB (has no administration in time range)  cefTRIAXone (ROCEPHIN) 2 g in sodium chloride 0.9 % 100 mL IVPB (has no administration in time range)  atorvastatin (LIPITOR) tablet 40 mg (40 mg Oral Given 02/05/22 1130)  apixaban (ELIQUIS) tablet 5 mg (5 mg Oral Given 02/05/22 2235)  gabapentin (NEURONTIN) capsule 600 mg (600 mg Oral Given 02/05/22 2235)  insulin aspart (novoLOG) injection 0-15 Units (3 Units Subcutaneous Given 02/05/22 1601)  carvedilol (COREG) tablet 12.5 mg (12.5 mg Oral Given 02/05/22 2235)  ondansetron (ZOFRAN) injection 4 mg (has no administration in time range)  Oral care mouth rinse (has no administration in time range)  ondansetron (ZOFRAN) injection 4 mg (4 mg Intravenous Given 02/05/22 0652)  cefTRIAXone (ROCEPHIN) 1 g in sodium chloride 0.9 % 100 mL IVPB (0 g Intravenous Stopped 02/05/22 0723)  azithromycin (ZITHROMAX) 500 mg in sodium chloride 0.9 % 250 mL IVPB (0 mg Intravenous Stopped 02/05/22 0851)  acetaminophen (TYLENOL) tablet 1,000 mg (1,000 mg Oral Given 02/05/22 0720)  lactated ringers bolus 1,000 mL (0 mLs Intravenous Stopping previously hung infusion 02/05/22 1240)  ibuprofen (ADVIL) tablet 400 mg (400 mg Oral Given 02/05/22 0720)  lactated ringers bolus 500 mL ( Intravenous Stopped  02/05/22 1342)   6:21 AM Cardizem bolus and drip ordered for atrial fibrillation with rapid ventricular response.  6:42 AM Patient negative for COVID.  There are some patchy infiltrates seen on chest x-ray.  We will go ahead and start antibiotics.  Procalcitonin is pending.  Lactate level is within normal limits and the patient is not hypotensive so I do not believe he meets sepsis criteria at this time. Rate improved with Cardizem.  7:00 AM Signed out to Dr. 04/07/22.  PROCEDURES  Procedures CRITICAL CARE Performed by: Elpidio Anis Abrielle Finck Total critical care time: 30 minutes Critical care time was exclusive of separately billable procedures and treating other patients. Critical care was necessary to treat or prevent imminent or life-threatening deterioration. Critical care was time spent personally by me on the following activities: development of treatment plan with patient and/or surrogate  as well as nursing, discussions with consultants, evaluation of patient's response to treatment, examination of patient, obtaining history from patient or surrogate, ordering and performing treatments and interventions, ordering and review of laboratory studies, ordering and review of radiographic studies, pulse oximetry and re-evaluation of patient's condition.   ED DIAGNOSES     ICD-10-CM   1. Hypoxia  R09.02     2. Atrial fibrillation with RVR (HCC)  I48.91     3. Multifocal pneumonia  J18.9          Xiamara Hulet, Jonny Ruiz, MD 02/05/22 2239

## 2022-02-05 NOTE — Progress Notes (Signed)
  Echocardiogram 2D Echocardiogram has been performed.  Gerda Diss 02/05/2022, 1:48 PM

## 2022-02-05 NOTE — Progress Notes (Signed)
Diltiazem stopped at 1321.  Carvedilol given. BP stable.

## 2022-02-05 NOTE — Progress Notes (Signed)
  Transition of Care [facility]) Screening Note   Patient Details  Name: Peter Robinson Date of Birth: 13-Nov-1955   Transition of Care [facility]) CM/SW Contact:    Delilah Shan, LCSWA Phone Number: 02/05/2022, 5:04 PM    Transition of Care Department [facility]) has reviewed patient and no TOC needs have been identified at this time. We will continue to monitor patient advancement through interdisciplinary progression rounds. If new patient transition needs arise, please place a TOC consult.

## 2022-02-05 NOTE — ED Notes (Signed)
Report given to [facility] 

## 2022-02-05 NOTE — Progress Notes (Signed)
RT set up HFNC and placed pt on 25L/80%. Patient is tolerating settings well at this time. RT will continue to monitor.

## 2022-02-05 NOTE — ED Provider Notes (Signed)
  Physical Exam  BP 126/88   Pulse (!) 121   Temp 99.8 F (37.7 C) (Oral)   Resp 17   Ht 6' (1.829 m)   Wt 93 kg   SpO2 98%   BMI 27.80 kg/m   Physical Exam  Procedures  Procedures CRITICAL CARE Performed by: Mardene Sayer   Total critical care time: 30 minutes  Critical care time was exclusive of separately billable procedures and treating other patients.  Critical care was necessary to treat or prevent imminent or life-threatening deterioration.  Critical care was time spent personally by me on the following activities: development of treatment plan with patient and/or surrogate as well as nursing, discussions with consultants, evaluation of patient's response to treatment, examination of patient, obtaining history from patient or surrogate, ordering and performing treatments and interventions, ordering and review of laboratory studies, ordering and review of radiographic studies, pulse oximetry and re-evaluation of patient's condition.  ED Course / MDM   Clinical Course as of 02/05/22 0811  Tue Feb 05, 2022  0658 PT with DM, HTN, A fib now with Multifocal pneumonia, COVID negative, with A fib RVR on Cardizem gtt and Rocephin and azithromycin. On Hi Flo at 90% for initial O2 sat 76%. D dimer negative. [VB]  [id number] Chest x-ray suggested mass however patient too unstable for a CT scan at this time.  Less likely mass due to sudden onset, no hemoptysis , although does have risk factor of previous smoking history. Patient's most current heart rate in the 110s.  He is currently on 35 L at 100% with NRB for mouth breathing satting 95%. Consulting intensivist. [VB]  [phone] Spoke with Dr. Myrla Halsted of intensive care who is excepted patient for admission to the ICU.  He requested a Precedex drip for patient's anxiety however, I spoke with our team here at [facility] who does not have Precedex. [VB]    Clinical Course User Index [VB] Mardene Sayer, MD   Medical  Decision Making Amount and/or Complexity of Data Reviewed Labs: ordered. Radiology: ordered.  Risk OTC drugs. Prescription drug management. Decision regarding hospitalization.          Mardene Sayer, MD 02/05/22 [id number]

## 2022-02-05 NOTE — Progress Notes (Signed)
RT placed patient on 6L [place] per MD. HFNC is at bedside if needed. RT will continue to monitor.

## 2022-02-05 NOTE — H&P (Signed)
NAME:  Peter Robinson, MRN:  [id number], DOB:  Sep 30, 1955, LOS: 0 ADMISSION DATE:  02/05/2022, CONSULTATION DATE:  02/05/2022 REFERRING MD:  Dr. Elpidio Anis, CHIEF COMPLAINT:  Respiratory Failure    History of Present Illness:  66 year old male presents to [facility] 9/12 with respiratory distress. Per patient was on a golf trip this weekend at the beach. On Sunday began to have a sore throat, since has progressed to shortness of breath with cough and chills. On arrival to ED oxygen saturation 76%, HR 140, temp 102.6. Placed on NRB, ultimately requiring heated high flow. Started on Cardizem gtt. CXR with consolidative opacity in the left mid lung with opacity at the base, concern for neoplasm vs pneumonia. Given Rocephin/Azthromycin. COVID negative. RVP pending. Transferred to [facility] for further evaluation.   On arrival to ICU on 100%/45L. Oxygen saturation 98%. On 10 mg/hr Cardizem, HR 101.   Pertinent  Medical History  DM, HTN, HLD, A.Fib on Eliquis   Significant Hospital Events: Including procedures, antibiotic start and stop dates in addition to other pertinent events   9/12 > Presents to ED   Interim History / Subjective:  As above.   Objective   Blood pressure 111/77, pulse 94, temperature 99.8 F (37.7 C), temperature source Oral, resp. rate (!) 22, height 6' (1.829 m), weight 93 kg, SpO2 92 %.    FiO2 (%):  [80 %-100 %] 80 %   Intake/Output Summary (Last 24 hours) at 02/05/2022 1019 Last data filed at 02/05/2022 0815 Gross per 24 hour  Intake 94.69 ml  Output 475 ml  Net -380.31 ml   Filed Weights   02/05/22 0553  Weight: 93 kg    Examination: General: Adult male, lying in bed, no distress  HENT: Dry MM  Lungs: Diminished breath sounds, right greater diminished over base Cardiovascular: Irregular, A.Fib, HR 101 Abdomen: distended, soft, active bowels sounds  Extremities: -edema  Neuro: alert, oriented, follows commands  GU: deferred   Resolved Hospital  Problem list     Assessment & Plan:   Acute Hypoxic Respiratory Failure, presumed viral in nature, CXR with concerns for left mid lobe opacity H/O intermittent tobacco abuse, none for over 5 years  -COVID negative  Plan -Titrate Supplemental oxygen for saturation >92 -Encourage good pulmonary hygiene with IS/Flutter -Continue Azithromycin/Rocephin for CAP coverage  -RVP pending. Strep P, Legionella pending.  -Will follow CXR with left mid opacity. Consider CT for further evaluation if no improvement.   A.Fib RVR H/O A.Fib on Eliquis  HTN, HLD  Plan -Cardiac Monitoring  -Titrate Cardizem gtt for HR goal >120 -Restart home Coreg  -Hold home Norvasc, Cardura, Lasix, Losartan for now  -ECHO pending  DM Plan -Hold home metformin, glipizide  -SSI   Insomnia  Plan -Patient states takes Neurontin at home for sleep. Will continue at [place] [facility]   Best Practice (right click and "Reselect all SmartList Selections" daily)   Diet/type: Regular consistency (see orders) DVT prophylaxis: DOAC GI prophylaxis: N/A Lines: N/A Foley:  N/A Code Status:  full code Last date of multidisciplinary goals of care discussion [9/12 updated wife and patient at bedside]  Labs   CBC: Recent Labs  Lab 02/05/22 0615 02/05/22 0721  WBC 15.8*  --   NEUTROABS 14.2*  --   HGB 14.8 14.6  HCT 44.4 43.0  MCV 93.1  --   PLT 231  --     Basic Metabolic Panel: Recent Labs  Lab 02/05/22 0615 02/05/22 0721  NA  140 140  K 4.2 4.0  CL 106  --   CO2 27  --   GLUCOSE 206*  --   BUN 19  --   CREATININE 1.29*  --   CALCIUM 8.5*  --    GFR: Estimated Creatinine Clearance: 61 mL/min (A) (by C-G formula based on SCr of 1.29 mg/dL (H)). Recent Labs  Lab 02/05/22 0615 02/05/22 0636  PROCALCITON  --  <0.10  WBC 15.8*  --   LATICACIDVEN 1.5  --     Liver Function Tests: No results for input(s): "AST", "ALT", "ALKPHOS", "BILITOT", "PROT", "ALBUMIN" in the last 168 hours. No results for input(s):  "LIPASE", "AMYLASE" in the last 168 hours. No results for input(s): "AMMONIA" in the last 168 hours.  ABG    Component Value Date/Time   HCO3 29.7 (H) 02/05/2022 0721   TCO2 31 02/05/2022 0721   O2SAT 28 02/05/2022 0721     Coagulation Profile: No results for input(s): "INR", "PROTIME" in the last 168 hours.  Cardiac Enzymes: No results for input(s): "CKTOTAL", "CKMB", "CKMBINDEX", "TROPONINI" in the last 168 hours.  HbA1C: No results found for: "HGBA1C"  CBG: No results for input(s): "GLUCAP" in the last 168 hours.  Review of Systems:   +Cough, SOB, Denies Chest Pain, Palpitations.   Past Medical History:  He,  has a past medical history of Atrial fibrillation (HCC), Diabetes mellitus without complication (HCC), and Hypertension.   Surgical History:   Past Surgical History:  Procedure Laterality Date   CATARACT EXTRACTION Bilateral      Social History:   reports that he has quit smoking. His smoking use included cigarettes. He has never used smokeless tobacco. He reports current alcohol use. He reports that he does not use drugs.   Family History:  His family history is not on file.   Allergies No Known Allergies   Home Medications  Prior to Admission medications   Medication Sig Start Date End Date Taking? Authorizing Provider  amLODipine (NORVASC) 2.5 MG tablet Take 2.5 mg by mouth daily. 12/28/21   [provider]  atorvastatin (LIPITOR) 40 MG tablet Take 40 mg by mouth daily. 11/21/21   [provider]  carvedilol (COREG) 12.5 MG tablet Take 12.5 mg by mouth 2 (two) times daily. 11/21/21   [provider]  doxazosin (CARDURA) 2 MG tablet Take 2 mg by mouth daily. 11/21/21   [provider]  ELIQUIS 5 MG TABS tablet Take 5 mg by mouth 2 (two) times daily. 08/10/21   [provider]  furosemide (LASIX) 20 MG tablet Take 20 mg by mouth daily. 01/23/22   [provider]  gabapentin (NEURONTIN) 300 MG capsule Take  600 mg by mouth 2 (two) times daily. 01/12/22   [provider]  glipiZIDE (GLUCOTROL XL) 10 MG 24 hr tablet Take 10 mg by mouth 2 (two) times daily. 12/28/21   [provider]  losartan (COZAAR) 100 MG tablet Take 100 mg by mouth daily. 11/21/21   [provider]  metFORMIN (GLUCOPHAGE-XR) 500 MG 24 hr tablet Take 500 mg by mouth daily. 11/30/21   [provider]  predniSONE (DELTASONE) 20 MG tablet Take 20 mg by mouth daily as needed. 01/11/22   [provider]     Critical care time: 42 minutes     CRITICAL CARE Performed by: Tobey Grim   Total critical care time: 42 minutes  Critical care time was exclusive of separately billable procedures and treating other patients.  Critical care was necessary to treat or prevent imminent or life-threatening deterioration.  Critical care was time spent personally by me on the following activities: development of treatment plan with patient and/or surrogate as well as nursing, discussions with consultants, evaluation of patient's response to treatment, examination of patient, obtaining history from patient or surrogate, ordering and performing treatments and interventions, ordering and review of laboratory studies, ordering and review of radiographic studies, pulse oximetry and re-evaluation of patient's condition.  Hayden Pedro, AGACNP-BC [facility]  PCCM Pgr: [phone]

## 2022-02-06 ENCOUNTER — Inpatient Hospital Stay (HOSPITAL_COMMUNITY): Payer: Medicare Other

## 2022-02-06 ENCOUNTER — Telehealth (HOSPITAL_COMMUNITY): Payer: Self-pay | Admitting: Pharmacy Technician

## 2022-02-06 ENCOUNTER — Other Ambulatory Visit (HOSPITAL_COMMUNITY): Payer: Self-pay

## 2022-02-06 DIAGNOSIS — J9601 Acute respiratory failure with hypoxia: Secondary | ICD-10-CM | POA: Diagnosis not present

## 2022-02-06 DIAGNOSIS — I4891 Unspecified atrial fibrillation: Secondary | ICD-10-CM

## 2022-02-06 DIAGNOSIS — J189 Pneumonia, unspecified organism: Secondary | ICD-10-CM | POA: Diagnosis not present

## 2022-02-06 LAB — BASIC METABOLIC PANEL
Anion gap: 7 (ref 5–15)
BUN: 15 mg/dL (ref 8–23)
CO2: 22 mmol/L (ref 22–32)
Calcium: 8.3 mg/dL — ABNORMAL LOW (ref 8.9–10.3)
Chloride: 109 mmol/L (ref 98–111)
Creatinine, Ser: 1.23 mg/dL (ref 0.61–1.24)
GFR, Estimated: >60 mL/min (ref >60)
Glucose, Bld: 157 mg/dL — ABNORMAL HIGH (ref 70–99)
Potassium: 3.9 mmol/L (ref 3.5–5.1)
Sodium: 138 mmol/L (ref 135–145)

## 2022-02-06 LAB — HEMOGLOBIN A1C
Hgb A1c MFr Bld: 6.8 % — ABNORMAL HIGH (ref 4.8–5.6)
Mean Plasma Glucose: 148.46 mg/dL

## 2022-02-06 LAB — LIPID PANEL
Cholesterol: 117 mg/dL (ref 0–200)
HDL: 49 mg/dL (ref >40)
LDL Cholesterol: 58 mg/dL (ref 0–99)
Total CHOL/HDL Ratio: 2.4 RATIO
Triglycerides: 51 mg/dL (ref <150)
VLDL: 10 mg/dL (ref 0–40)

## 2022-02-06 LAB — PROCALCITONIN: Procalcitonin: 4.42 ng/mL

## 2022-02-06 LAB — MAGNESIUM: Magnesium: 1.8 mg/dL (ref 1.7–2.4)

## 2022-02-06 LAB — GLUCOSE, CAPILLARY
Glucose-Capillary: 158 mg/dL — ABNORMAL HIGH (ref 70–99)
Glucose-Capillary: 201 mg/dL — ABNORMAL HIGH (ref 70–99)
Glucose-Capillary: 141 mg/dL — ABNORMAL HIGH (ref 70–99)
Glucose-Capillary: 173 mg/dL — ABNORMAL HIGH (ref 70–99)
Glucose-Capillary: 163 mg/dL — ABNORMAL HIGH (ref 70–99)

## 2022-02-06 LAB — CBC
HCT: 38.4 % — ABNORMAL LOW (ref 39.0–52.0)
Hemoglobin: 13.1 g/dL (ref 13.0–17.0)
MCH: 31.7 pg (ref 26.0–34.0)
MCHC: 34.1 g/dL (ref 30.0–36.0)
MCV: 93 fL (ref 80.0–100.0)
Platelets: 175 10*3/uL (ref 150–400)
RBC: 4.13 MIL/uL — ABNORMAL LOW (ref 4.22–5.81)
RDW: 13.5 % (ref 11.5–15.5)
WBC: 16.7 10*3/uL — ABNORMAL HIGH (ref 4.0–10.5)
nRBC: 0 % (ref 0.0–0.2)

## 2022-02-06 LAB — PHOSPHORUS: Phosphorus: 2.1 mg/dL — ABNORMAL LOW (ref 2.5–4.6)

## 2022-02-06 MED ORDER — FUROSEMIDE 20 MG PO TABS
20.0000 mg | ORAL_TABLET | Freq: Once | ORAL | Status: AC
  Administered 2022-02-06: 20 mg via ORAL
  Filled 2022-02-06: qty 1

## 2022-02-06 MED ORDER — HYDRALAZINE HCL 20 MG/ML IJ SOLN: 10.0000 mg | Freq: Four times a day (QID) | INTRAMUSCULAR | Status: DC | PRN

## 2022-02-06 MED ORDER — LOSARTAN POTASSIUM 50 MG PO TABS
50.0000 mg | ORAL_TABLET | Freq: Every day | ORAL | Status: DC
  Administered 2022-02-06: 50 mg via ORAL
  Filled 2022-02-06 (×2): qty 1

## 2022-02-06 MED ORDER — HYDRALAZINE HCL 20 MG/ML IJ SOLN
INTRAMUSCULAR | Status: AC
  Administered 2022-02-06: 10 mg via INTRAVENOUS
  Filled 2022-02-06: qty 1

## 2022-02-06 MED ORDER — AMLODIPINE BESYLATE 5 MG PO TABS
2.5000 mg | ORAL_TABLET | Freq: Every day | ORAL | Status: DC
  Administered 2022-02-06 – 2022-02-08 (×3): 2.5 mg via ORAL
  Filled 2022-02-06 (×3): qty 1

## 2022-02-06 MED ORDER — SALINE SPRAY 0.65 % NA SOLN
1.0000 | NASAL | Status: DC | PRN
  Filled 2022-02-06: qty 44

## 2022-02-06 MED ORDER — CHLORHEXIDINE GLUCONATE CLOTH 2 % EX PADS
6.0000 | MEDICATED_PAD | Freq: Every day | CUTANEOUS | Status: DC
  Administered 2022-02-06 – 2022-02-07 (×2): 6 via TOPICAL

## 2022-02-06 NOTE — Telephone Encounter (Signed)
Pharmacy Patient Advocate Encounter  Insurance verification completed.    The patient is insured through [employer] Medicare Part D   The patient is currently admitted and ran test claims for the following: Eliquis .  Copays and coinsurance results were relayed to Inpatient clinical team.  

## 2022-02-06 NOTE — TOC Benefit Eligibility Note (Signed)
Patient Product/process development scientist completed.    The patient is currently admitted and upon discharge could be taking Eliquis 5 mg.  The current 30 day co-pay is $173.19.   The patient is insured through [employer] Medicare Part D     Roland Earl, CPhT Pharmacy Patient Advocate Specialist [facility] Patient Advocate Team Direct Number: [phone]  Fax: [phone]

## 2022-02-06 NOTE — Progress Notes (Signed)
NAME:  Peter Robinson, MRN:  [id number], DOB:  1955-08-27, LOS: 1 ADMISSION DATE:  02/05/2022, CONSULTATION DATE:  02/05/2022 REFERRING MD:  Dr. Elpidio Anis, CHIEF COMPLAINT:  Respiratory Failure    History of Present Illness:  66 year old man who presented to [facility] 9/12 with respiratory distress. Patient reports he was on a golf trip at the beach the weekend PTA; began to have a sore throat progressing to SOB with cough and chills. PMHx significant for HTN, HLD, Afib (on Eliquis), T2DM.  On arrival to ED oxygen saturation 76%, HR 140, temp 102.6. Placed on NRB, ultimately requiring heated high flow. Started on Cardizem gtt. CXR with consolidative opacity in the left mid lung with opacity at the base, concern for neoplasm vs pneumonia. Given Rocephin/Azthromycin. COVID negative. RVP pending. Transferred to [facility] for further evaluation.   On arrival to ICU on 100%/45L. Oxygen saturation 98%. Required Cardizem gtt initiation, HR 101.   Pertinent Medical History:  DM, HTN, HLD, AFib on Eliquis   Significant Hospital Events: Including procedures, antibiotic start and stop dates in addition to other pertinent events   9/12 - Presents to ED 9/13 Stable O2 requirement 5LNC. CXR with worsening L-sided opacities. CT Chest for better characterization.  Interim History / Subjective:  No significant events overnight Subjectively feeling a little better, not back to baseline Denies CP, endorses mild SOB No swelling or dyspnea with position change Remains on 5LNC CXR looks a bit worse today, will obtain CT Chest Remains off of diltiazem, ok to transfer to floor  Objective   Blood pressure (!) 143/98, pulse (!) 107, temperature 98.2 F (36.8 C), temperature source Oral, resp. rate 16, height 6' (1.829 m), weight 93 kg, SpO2 94 %.    FiO2 (%):  [80 %-100 %] 80 %   Intake/Output Summary (Last 24 hours) at 02/06/2022 0716 Last data filed at 02/05/2022 2200 Gross per 24 hour  Intake 1179.71 ml   Output 1225 ml  Net -45.29 ml    Filed Weights   02/05/22 0553  Weight: 93 kg   Physical Examination: General: Acutely ill-appearing middle-aged man in NAD. Up to chair at bedside. HEENT: [place]/AT, anicteric sclera, PERRL, moist mucous membranes. [place] in place. Neuro: Awake, oriented x 4. Responds to verbal stimuli. Following commands consistently. Moves all 4 extremities spontaneously. Strength 5/5 in all 4 extremities. CV: Irregularly irregular rhythm, rate 100s, no m/g/r. PULM: Breathing even and unlabored on 5LNC. Mild conversational SOB. Lung fields with rhonchi bilaterally, faint crackles at R base. GI: Soft, nontender, nondistended. Normoactive bowel sounds. Extremities: No LE edema noted. Skin: Warm/dry, no rashes.  Resolved Hospital Problem List:    Assessment & Plan:   Acute Hypoxic Respiratory Failure, presumed viral in nature, CXR with concerns for left mid lobe opacity H/O intermittent tobacco abuse, none for over 5 years  COVID negative, RVP negative. Strep pneumo negative. - Continue supplemental O2 support - Wean O2 for sat > 90% - Pulmonary hygiene; IS - Legionella pending - Continue CAP coverage (ceftriaxone, azithromycin) - Persistent CXR opacities 9/13; obtain CT Chest  A.Fib RVR H/O A.Fib on Eliquis  HTN, HLD  Echo 9/12 with EF 60-65%, septal flattening, increased RV pressure/volume overload. - Remains off of Diltiazem gtt - Goal HR  < 120 - Home Coreg resumed - Continue Eliquis - Resume home Lasix x 1 dose 9/13, assess O2 requirement - Hold home Norvasc, Cardura, Losartan; resume as appropriate - Cardiac monitoring  DM - Hold home metformin, glipizide - SSI - CBGs  AC/HS - Goal CBG 140-180  Insomnia  - Continue gabapentin QHS  Likely stable for transfer out of ICU to floor today, 9/13.  Best Practice (right click and "Reselect all SmartList Selections" daily)   Diet/type: Regular consistency (see orders) DVT prophylaxis: DOAC GI  prophylaxis: N/A Lines: N/A Foley:  N/A Code Status:  full code Last date of multidisciplinary goals of care discussion [9/12 updated wife and patient at bedside]  Critical care time:    The patient is critically ill with multiple organ system failure and requires high complexity decision making for assessment and support, frequent evaluation and titration of therapies, advanced monitoring, review of radiographic studies and interpretation of complex data.   Critical Care Time devoted to patient care services, exclusive of separately billable procedures, described in this note is 35 minutes.  Tim Lair, PA-C [facility] 02/06/22 7:16 AM  Please see [email or web address] for pager details.  From 7A-7P if no response, please call [phone] After hours, please call ELink [phone]

## 2022-02-07 ENCOUNTER — Other Ambulatory Visit: Payer: Self-pay

## 2022-02-07 DIAGNOSIS — J189 Pneumonia, unspecified organism: Secondary | ICD-10-CM | POA: Diagnosis not present

## 2022-02-07 LAB — CBC
HCT: 34.5 % — ABNORMAL LOW (ref 39.0–52.0)
Hemoglobin: 11.8 g/dL — ABNORMAL LOW (ref 13.0–17.0)
MCH: 31.9 pg (ref 26.0–34.0)
MCHC: 34.2 g/dL (ref 30.0–36.0)
MCV: 93.2 fL (ref 80.0–100.0)
Platelets: 168 K/uL (ref 150–400)
RBC: 3.7 MIL/uL — ABNORMAL LOW (ref 4.22–5.81)
RDW: 13.2 % (ref 11.5–15.5)
WBC: 15.5 K/uL — ABNORMAL HIGH (ref 4.0–10.5)
nRBC: 0 % (ref 0.0–0.2)

## 2022-02-07 LAB — BASIC METABOLIC PANEL
Anion gap: 8 (ref 5–15)
BUN: 14 mg/dL (ref 8–23)
CO2: 25 mmol/L (ref 22–32)
Calcium: 8.2 mg/dL — ABNORMAL LOW (ref 8.9–10.3)
Chloride: 104 mmol/L (ref 98–111)
Creatinine, Ser: 1.17 mg/dL (ref 0.61–1.24)
GFR, Estimated: >60 mL/min (ref >60)
Glucose, Bld: 138 mg/dL — ABNORMAL HIGH (ref 70–99)
Potassium: 3.5 mmol/L (ref 3.5–5.1)
Sodium: 137 mmol/L (ref 135–145)

## 2022-02-07 LAB — GLUCOSE, CAPILLARY
Glucose-Capillary: 133 mg/dL — ABNORMAL HIGH (ref 70–99)
Glucose-Capillary: 164 mg/dL — ABNORMAL HIGH (ref 70–99)
Glucose-Capillary: 154 mg/dL — ABNORMAL HIGH (ref 70–99)
Glucose-Capillary: 140 mg/dL — ABNORMAL HIGH (ref 70–99)

## 2022-02-07 LAB — MAGNESIUM: Magnesium: 1.7 mg/dL (ref 1.7–2.4)

## 2022-02-07 LAB — PROCALCITONIN: Procalcitonin: 2.76 ng/mL

## 2022-02-07 LAB — PHOSPHORUS: Phosphorus: 2.3 mg/dL — ABNORMAL LOW (ref 2.5–4.6)

## 2022-02-07 MED ORDER — PNEUMOCOCCAL 20-VAL CONJ VACC 0.5 ML IM SUSY
0.5000 mL | PREFILLED_SYRINGE | INTRAMUSCULAR | Status: AC
  Administered 2022-02-08: 0.5 mL via INTRAMUSCULAR
  Filled 2022-02-07: qty 0.5

## 2022-02-07 MED ORDER — INFLUENZA VAC A&B SA ADJ QUAD 0.5 ML IM PRSY
0.5000 mL | PREFILLED_SYRINGE | INTRAMUSCULAR | Status: AC
  Administered 2022-02-08: 0.5 mL via INTRAMUSCULAR
  Filled 2022-02-07: qty 0.5

## 2022-02-07 MED ORDER — MAGNESIUM SULFATE 2 GM/50ML IV SOLN
2.0000 g | Freq: Once | INTRAVENOUS | Status: AC
  Administered 2022-02-07: 2 g via INTRAVENOUS
  Filled 2022-02-07: qty 50

## 2022-02-07 MED ORDER — FUROSEMIDE 40 MG PO TABS
40.0000 mg | ORAL_TABLET | Freq: Every day | ORAL | Status: DC
  Administered 2022-02-07: 40 mg via ORAL
  Filled 2022-02-07: qty 1

## 2022-02-07 MED ORDER — FUROSEMIDE 20 MG PO TABS
20.0000 mg | ORAL_TABLET | Freq: Every day | ORAL | Status: DC
  Administered 2022-02-08: 20 mg via ORAL
  Filled 2022-02-07: qty 1

## 2022-02-07 MED ORDER — LOSARTAN POTASSIUM 50 MG PO TABS
100.0000 mg | ORAL_TABLET | Freq: Every day | ORAL | Status: DC
  Administered 2022-02-07 – 2022-02-08 (×2): 100 mg via ORAL
  Filled 2022-02-07 (×2): qty 2

## 2022-02-07 MED ORDER — HYDRALAZINE HCL 20 MG/ML IJ SOLN: 10.0000 mg | Freq: Four times a day (QID) | INTRAMUSCULAR | Status: DC | PRN

## 2022-02-07 NOTE — Progress Notes (Addendum)
PROGRESS NOTE  Peter Robinson [id number] DOB: 29-Mar-1956 DOA: 02/05/2022 PCP: Jeni Salles, MD  HPI/Recap of past 25 hours: 66 year old male with a history of diabetes, hypertension, hyperlipidemia, A-fib on Eliquis, presents with worsening shortness of breath with cough and chills. On arrival to ED oxygen saturation 76%, HR 140, temp 102.6. Placed on NRB, ultimately requiring heated high flow. Started on Cardizem gtt. CXR with consolidative opacity in the left mid lung with opacity at the base, concern for neoplasm vs pneumonia. COVID and RVP negative.  Patient was admitted to the ICU requiring about 45L heated high flow.  Patient was further stabilized.  [facility] assumed care 02/07/2022.    Today, patient reports feeling better, still requiring oxygen, about 5 L at rest.  Patient denies any chest pain, worsening shortness of breath, abdominal pain, nausea/vomiting, fever/chills.  All questions answered.    Assessment/Plan: Principal Problem:   Multifocal pneumonia  Acute Hypoxic Respiratory Failure Sepsis likely 2/2 CAP (fever, tachycardia, leukocytosis) CXR with concerns for left mid lobe opacity H/O intermittent tobacco abuse, none for over 5 years  COVID negative, RVP negative. Strep pneumo negative, Legionella pending Procalcitonin 4.42--> 2.76 Repeat CT chest shows bilateral pulmonary infiltrates consistent with multilobar pneumonia.  Ill-defined nodularity throughout the anterior mediastinal fat, could reflect residual thymic tissue or small lymph nodes.  Recommend follow-up CT chest with contrast in 3 to 4 months Continue supplemental O2 support, plan to wean Pulmonary hygiene; IS Continue CAP coverage (ceftriaxone, azithromycin) Ambulate patient  A.Fib RVR H/O A.Fib on Eliquis  HTN, HLD  Echo 9/12 with EF 60-65%, septal flattening, increased RV pressure/volume overload Continue Coreg, Eliquis Continue losartan, amlodipine Start home Lasix Hold  home Norvasc, Cardura, resume as appropriate Telemetry   DM type 2 Last A1c 6.8 SSI, Accu-Cheks, hypoglycemic protocol Hold home metformin, glipizide   Insomnia  Continue gabapentin QHS     Estimated body mass index is 27.45 kg/m as calculated from the following:   Height as of this encounter: 6' (1.829 m).   Weight as of this encounter: 91.8 kg.     Code Status: Full  Family Communication: None at bedside  Disposition Plan: Status is: Inpatient Remains inpatient appropriate because: Level of care      Consultants: PCCM  Procedures: None  Antimicrobials: Ceftriaxone Azithromycin   DVT prophylaxis: Eliquis   Objective: Vitals:   02/07/22 0900 02/07/22 1000 02/07/22 1155 02/07/22 1200  BP: 134/84 117/84  (!) 120/58  Pulse: 96 95  88  Resp: 11 18  (!) 21  Temp:   98.2 F (36.8 C)   TempSrc:   Oral   SpO2: 92% 94%  91%  Weight:      Height:        Intake/Output Summary (Last 24 hours) at 02/07/2022 1242 Last data filed at 02/07/2022 1200 Gross per 24 hour  Intake 590.83 ml  Output 1525 ml  Net -934.17 ml   Filed Weights   02/05/22 0553 02/07/22 0500  Weight: 93 kg 91.8 kg    Exam: General: NAD  Cardiovascular: S1, S2 present Respiratory: CTAB Abdomen: Soft, nontender, nondistended, bowel sounds present Musculoskeletal: No bilateral pedal edema noted Skin: Normal Psychiatry: Normal mood     Data Reviewed: CBC: Recent Labs  Lab 02/05/22 0615 02/05/22 0721 02/06/22 0650 02/07/22 0603  WBC 15.8*  --  16.7* 15.5*  NEUTROABS 14.2*  --   --   --   HGB 14.8 14.6 13.1 11.8*  HCT 44.4 43.0 38.4* 34.5*  MCV 93.1  --  93.0 93.2  PLT 231  --  175 168   Basic Metabolic Panel: Recent Labs  Lab 02/05/22 0615 02/05/22 0721 02/06/22 0650 02/07/22 0603  NA 140 140 138 137  K 4.2 4.0 3.9 3.5  CL 106  --  109 104  CO2 27  --  22 25  GLUCOSE 206*  --  157* 138*  BUN 19  --  15 14  CREATININE 1.29*  --  1.23 1.17  CALCIUM 8.5*  --   8.3* 8.2*  MG  --   --  1.8 1.7  PHOS  --   --  2.1* 2.3*   GFR: Estimated Creatinine Clearance: 67.2 mL/min (by C-G formula based on SCr of 1.17 mg/dL). Liver Function Tests: No results for input(s): "AST", "ALT", "ALKPHOS", "BILITOT", "PROT", "ALBUMIN" in the last 168 hours. No results for input(s): "LIPASE", "AMYLASE" in the last 168 hours. No results for input(s): "AMMONIA" in the last 168 hours. Coagulation Profile: No results for input(s): "INR", "PROTIME" in the last 168 hours. Cardiac Enzymes: No results for input(s): "CKTOTAL", "CKMB", "CKMBINDEX", "TROPONINI" in the last 168 hours. BNP (last 3 results) No results for input(s): "PROBNP" in the last 8760 hours. HbA1C: Recent Labs    02/06/22 0650  HGBA1C 6.8*   CBG: Recent Labs  Lab 02/06/22 1141 02/06/22 1636 02/06/22 2024 02/07/22 0707 02/07/22 1158  GLUCAP 141* 173* 163* 133* 164*   Lipid Profile: Recent Labs    02/06/22 0650  CHOL 117  HDL 49  LDLCALC 58  TRIG 51  CHOLHDL 2.4   Thyroid Function Tests: No results for input(s): "TSH", "T4TOTAL", "FREET4", "T3FREE", "THYROIDAB" in the last 72 hours. Anemia Panel: No results for input(s): "VITAMINB12", "FOLATE", "FERRITIN", "TIBC", "IRON", "RETICCTPCT" in the last 72 hours. Urine analysis: No results found for: "COLORURINE", "APPEARANCEUR", "LABSPEC", "PHURINE", "GLUCOSEU", "HGBUR", "BILIRUBINUR", "KETONESUR", "PROTEINUR", "UROBILINOGEN", "NITRITE", "LEUKOCYTESUR" Sepsis Labs: @LABRCNTIP (procalcitonin:4,lacticidven:4)  ) Recent Results (from the past 240 hour(s))  SARS Coronavirus 2 by RT PCR (hospital order, performed in [facility] hospital lab) *cepheid single result test* Anterior Nasal Swab     Status: None   Collection Time: 02/05/22  6:00 AM   Specimen: Anterior Nasal Swab  Result Value Ref Range Status   SARS Coronavirus 2 by RT PCR NEGATIVE NEGATIVE Final    Comment: (NOTE) SARS-CoV-2 target nucleic acids are NOT DETECTED.  The SARS-CoV-2  RNA is generally detectable in upper and lower respiratory specimens during the acute phase of infection. The lowest concentration of SARS-CoV-2 viral copies this assay can detect is 250 copies / mL. A negative result does not preclude SARS-CoV-2 infection and should not be used as the sole basis for treatment or other patient management decisions.  A negative result may occur with improper specimen collection / handling, submission of specimen other than nasopharyngeal swab, presence of viral mutation(s) within the areas targeted by this assay, and inadequate number of viral copies (<250 copies / mL). A negative result must be combined with clinical observations, patient history, and epidemiological information.  Fact Sheet for Patients:   04/07/22  Fact Sheet for Healthcare Providers: RoadLapTop.co.za  This test is not yet approved or  cleared by the http://kim-miller.com/ FDA and has been authorized for detection and/or diagnosis of SARS-CoV-2 by FDA under an Emergency Use Authorization (EUA).  This EUA will remain in effect (meaning this test can be used) for the duration of the COVID-19 declaration under Section 564(b)(1) of the Act, 21 U.S.C. section  360bbb-3(b)(1), unless the authorization is terminated or revoked sooner.  Performed at [facility], [address] [place], [place] [address]   Respiratory (~20 pathogens) panel by PCR     Status: None   Collection Time: 02/05/22  7:06 AM   Specimen: Nasopharyngeal Swab; Respiratory  Result Value Ref Range Status   Adenovirus NOT DETECTED NOT DETECTED Final   Coronavirus 229E NOT DETECTED NOT DETECTED Final    Comment: (NOTE) The Coronavirus on the Respiratory Panel, DOES NOT test for the novel  Coronavirus (2019 nCoV)    Coronavirus HKU1 NOT DETECTED NOT DETECTED Final   Coronavirus NL63 NOT DETECTED NOT DETECTED Final   Coronavirus OC43 NOT DETECTED NOT  DETECTED Final   Metapneumovirus NOT DETECTED NOT DETECTED Final   Rhinovirus / Enterovirus NOT DETECTED NOT DETECTED Final   Influenza A NOT DETECTED NOT DETECTED Final   Influenza B NOT DETECTED NOT DETECTED Final   Parainfluenza Virus 1 NOT DETECTED NOT DETECTED Final   Parainfluenza Virus 2 NOT DETECTED NOT DETECTED Final   Parainfluenza Virus 3 NOT DETECTED NOT DETECTED Final   Parainfluenza Virus 4 NOT DETECTED NOT DETECTED Final   Respiratory Syncytial Virus NOT DETECTED NOT DETECTED Final   Bordetella pertussis NOT DETECTED NOT DETECTED Final   Bordetella Parapertussis NOT DETECTED NOT DETECTED Final   Chlamydophila pneumoniae NOT DETECTED NOT DETECTED Final   Mycoplasma pneumoniae NOT DETECTED NOT DETECTED Final    Comment: Performed at [facility], [address] [place], [place] [address]  MRSA Next Gen by PCR, Nasal     Status: None   Collection Time: 02/05/22 10:11 AM   Specimen: Nasal Mucosa; Nasal Swab  Result Value Ref Range Status   MRSA by PCR Next Gen NOT DETECTED NOT DETECTED Final    Comment: (NOTE) The GeneXpert MRSA Assay (FDA approved for NASAL specimens only), is one component of a comprehensive MRSA colonization surveillance program. It is not intended to diagnose MRSA infection nor to guide or monitor treatment for MRSA infections. Test performance is not FDA approved in patients less than 73 years old. Performed at [facility], [address], [place], [place] [address]       Studies: No results found.  Scheduled Meds:  amLODipine  2.5 mg Oral Daily   apixaban  5 mg Oral BID   atorvastatin  40 mg Oral Daily   carvedilol  12.5 mg Oral BID   Chlorhexidine Gluconate Cloth  6 each Topical Daily   gabapentin  600 mg Oral QHS   insulin aspart  0-15 Units Subcutaneous TID AC & HS   losartan  100 mg Oral Daily    Continuous Infusions:  azithromycin Stopped (02/07/22 1011)   cefTRIAXone (ROCEPHIN)  IV Stopped (02/07/22 0816)    magnesium sulfate bolus IVPB 2 g (02/07/22 1207)     LOS: 2 days     Briant Cedar, MD [facility]  If 7PM-7AM, please contact night-coverage [email or web address] 02/07/2022, 12:42 PM

## 2022-02-07 NOTE — Progress Notes (Signed)
SATURATION QUALIFICATIONS: (This note is used to comply with regulatory documentation for home oxygen)  Patient Saturations on Room Air at Rest = 89%  Patient Saturations on Room Air while Ambulating = 84%  Patient Saturations on 3 Liters of oxygen while Ambulating = 92%  Please briefly explain why patient needs home oxygen: Pt desaturates while at rest on room air. Increased oxygen demands when exerting self at all.

## 2022-02-08 DIAGNOSIS — E119 Type 2 diabetes mellitus without complications: Secondary | ICD-10-CM

## 2022-02-08 DIAGNOSIS — I10 Essential (primary) hypertension: Secondary | ICD-10-CM

## 2022-02-08 DIAGNOSIS — J189 Pneumonia, unspecified organism: Secondary | ICD-10-CM | POA: Diagnosis not present

## 2022-02-08 LAB — BASIC METABOLIC PANEL
Anion gap: 7 (ref 5–15)
BUN: 15 mg/dL (ref 8–23)
CO2: 27 mmol/L (ref 22–32)
Calcium: 8.5 mg/dL — ABNORMAL LOW (ref 8.9–10.3)
Chloride: 105 mmol/L (ref 98–111)
Creatinine, Ser: 1.32 mg/dL — ABNORMAL HIGH (ref 0.61–1.24)
GFR, Estimated: 59 mL/min — ABNORMAL LOW (ref >60)
Glucose, Bld: 149 mg/dL — ABNORMAL HIGH (ref 70–99)
Potassium: 3.5 mmol/L (ref 3.5–5.1)
Sodium: 139 mmol/L (ref 135–145)

## 2022-02-08 LAB — MAGNESIUM: Magnesium: 2.1 mg/dL (ref 1.7–2.4)

## 2022-02-08 LAB — CBC
HCT: 36 % — ABNORMAL LOW (ref 39.0–52.0)
Hemoglobin: 12.3 g/dL — ABNORMAL LOW (ref 13.0–17.0)
MCH: 31.4 pg (ref 26.0–34.0)
MCHC: 34.2 g/dL (ref 30.0–36.0)
MCV: 91.8 fL (ref 80.0–100.0)
Platelets: 186 10*3/uL (ref 150–400)
RBC: 3.92 MIL/uL — ABNORMAL LOW (ref 4.22–5.81)
RDW: 13.2 % (ref 11.5–15.5)
WBC: 10.1 10*3/uL (ref 4.0–10.5)
nRBC: 0 % (ref 0.0–0.2)

## 2022-02-08 LAB — GLUCOSE, CAPILLARY
Glucose-Capillary: 150 mg/dL — ABNORMAL HIGH (ref 70–99)
Glucose-Capillary: 201 mg/dL — ABNORMAL HIGH (ref 70–99)

## 2022-02-08 MED ORDER — DM-GUAIFENESIN ER 30-600 MG PO TB12: 1.0000 | ORAL_TABLET | Freq: Two times a day (BID) | ORAL | 0 refills | Status: AC | PRN

## 2022-02-08 MED ORDER — AZITHROMYCIN 500 MG PO TABS: 500.0000 mg | ORAL_TABLET | Freq: Every day | ORAL | 0 refills | Status: AC

## 2022-02-08 MED ORDER — CEFDINIR 300 MG PO CAPS: 300.0000 mg | ORAL_CAPSULE | Freq: Two times a day (BID) | ORAL | 0 refills | Status: AC

## 2022-02-08 MED ORDER — GUAIFENESIN-DM 100-10 MG/5ML PO SYRP
5.0000 mL | ORAL_SOLUTION | ORAL | Status: DC | PRN
  Administered 2022-02-08: 5 mL via ORAL
  Filled 2022-02-08: qty 5

## 2022-02-08 NOTE — Progress Notes (Signed)
Pt education completed to include future appointments, current prescriptions and medications, and doctors discharge instructions. Pt alert and oriented, vital signs stable. Portable O2 delivered to bedside. All questions answered. Pt discharged with nursing staff. Temp: 98 F (36.7 C) (09/15 1200) Temp Source: Oral (09/15 1200) BP: 124/91 (09/15 1100) Pulse Rate: 86 (09/15 1300)  Jessica Priest 02/08/2022 3:43 PM

## 2022-02-08 NOTE — Care Management (Signed)
02-08-22 1132 Case Manager received notification that the patient will need oxygen for home. Case Manager spoke with the patient and verified address for delivery. Patient is agreeable to use [employer] for DME oxygen. Case Manager submitted the order to [employer] and once the order is placed the agency will deliver the portable tank to the room. Staff RN aware of plan of care. No further needs identified at this time.

## 2022-02-09 NOTE — Discharge Summary (Signed)
Physician Discharge Summary   Patient: Peter Robinson MRN: [id number] DOB: [birth year]  Admit date:     02/05/2022  Discharge date: 02/08/2022  Discharge Physician: Alma Friendly   PCP: Jeni Salles, MD   Recommendations at discharge:   Follow-up with PCP 1 week  Discharge Diagnoses: Principal Problem:   Multifocal pneumonia    Hospital Course: 66 year old male with a history of diabetes, hypertension, hyperlipidemia, A-fib on Eliquis, presents with worsening shortness of breath with cough and chills. On arrival to ED oxygen saturation 76%, HR 140, temp 102.6. Placed on NRB, ultimately requiring heated high flow. Started on Cardizem gtt. CXR with consolidative opacity in the left mid lung with opacity at the base, concern for neoplasm vs pneumonia. COVID and RVP negative.  Patient was admitted to the ICU requiring about 45L heated high flow.  Patient was further stabilized.  [facility] assumed care 02/07/2022.  Assessment and Plan:  Acute Hypoxic Respiratory Failure Sepsis likely 2/2 CAP (fever, tachycardia, leukocytosis) CXR with concerns for left mid lobe opacity H/O intermittent tobacco abuse, none for over 5 years  COVID negative, RVP negative. Strep pneumo negative, Legionella pending Procalcitonin 4.42--> 2.76 Repeat CT chest shows bilateral pulmonary infiltrates consistent with multilobar pneumonia.  Ill-defined nodularity throughout the anterior mediastinal fat, could reflect residual thymic tissue or small lymph nodes.  Recommend follow-up CT chest with contrast in 3 to 4 months Continue supplemental O2 support, plan to wean off, follow-up with PCP Discharged on cefdinir and azithromycin to complete 7 days   A.Fib RVR H/O A.Fib on Eliquis  HTN, HLD  Echo 9/12 with EF 60-65%, septal flattening, increased RV pressure/volume overload Continue Coreg, Eliquis Continue losartan, amlodipine, Lasix, Norvasc, Cardura Follow-up with PCP   DM type 2 Last  A1c 6.8 Resume home regimen   Insomnia  Continue gabapentin QHS       Consultants: PCCM Procedures performed: None Disposition: Home Diet recommendation:  Cardiac and Carb modified diet    DISCHARGE MEDICATION: Allergies as of 02/08/2022   No Known Allergies      Medication List     TAKE these medications    amLODipine 2.5 MG tablet Commonly known as: NORVASC Take 2.5 mg by mouth daily. Notes to patient: Next Dose 9/16   atorvastatin 40 MG tablet Commonly known as: LIPITOR Take 40 mg by mouth daily. Notes to patient: Next Dose 9/16   azithromycin 500 MG tablet Commonly known as: Zithromax Take 1 tablet (500 mg total) by mouth daily for 4 days. Take 1 tablet daily for 3 days.   carvedilol 12.5 MG tablet Commonly known as: COREG Take 12.5 mg by mouth 2 (two) times daily. Notes to patient: Next Dose 9/15 PM   cefdinir 300 MG capsule Commonly known as: OMNICEF Take 1 capsule (300 mg total) by mouth 2 (two) times daily for 4 days.   cyclobenzaprine 10 MG tablet Commonly known as: FLEXERIL Take 5-10 mg by mouth 3 (three) times daily as needed for muscle spasms.   dextromethorphan-guaiFENesin 30-600 MG 12hr tablet Commonly known as: MUCINEX DM Take 1 tablet by mouth 2 (two) times daily as needed for up to 5 days for cough.   doxazosin 2 MG tablet Commonly known as: CARDURA Take 2 mg by mouth daily.   Eliquis 5 MG Tabs tablet Generic drug: apixaban Take 5 mg by mouth 2 (two) times daily. Notes to patient: Next Dose 9/15 PM   furosemide 20 MG tablet Commonly known as: LASIX Take 20 mg  by mouth daily. Notes to patient: Next Dose 9/16   gabapentin 300 MG capsule Commonly known as: NEURONTIN Take 900 mg by mouth 2 (two) times daily. Notes to patient: Next Dose 9/15 PM   glipiZIDE 10 MG 24 hr tablet Commonly known as: GLUCOTROL XL Take 20 mg by mouth daily with breakfast. Notes to patient: Next Dose 9/16   losartan 100 MG tablet Commonly known  as: COZAAR Take 100 mg by mouth daily. Notes to patient: Next Dose 9/16   metFORMIN 500 MG 24 hr tablet Commonly known as: GLUCOPHAGE-XR Take 500 mg by mouth daily. Notes to patient: Next Dose 9/16   predniSONE 20 MG tablet Commonly known as: DELTASONE Take 20 mg by mouth daily as needed.        Follow-up Information     [facility] Follow up.   Why: Oxygen to be delivered to the room prior to discharge home. Contact information: [place] [address] [phone]                Discharge Exam: Filed Weights   02/05/22 0553 02/07/22 0500 02/08/22 0500  Weight: 93 kg 91.8 kg 91.8 kg   General: NAD  Cardiovascular: S1, S2 present Respiratory: CTAB Abdomen: Soft, nontender, nondistended, bowel sounds present Musculoskeletal: No bilateral pedal edema noted Skin: Normal Psychiatry: Normal mood   Condition at discharge: stable  The results of significant diagnostics from this hospitalization (including imaging, microbiology, ancillary and laboratory) are listed below for reference.   Imaging Studies: CT CHEST WO CONTRAST  Result Date: 02/06/2022 CLINICAL DATA:  Pneumonia. EXAM: CT CHEST WITHOUT CONTRAST TECHNIQUE: Multidetector CT imaging of the chest was performed following the standard protocol without IV contrast. RADIATION DOSE REDUCTION: This exam was performed according to the departmental dose-optimization program which includes automated exposure control, adjustment of the mA and/or kV according to patient size and/or use of iterative reconstruction technique. COMPARISON:  Chest x-ray 02/06/2022 FINDINGS: Cardiovascular: The heart is normal in size. No pericardial effusion. The aorta is normal in caliber. Scattered age advanced atherosclerotic calcifications. Age advanced three-vessel coronary artery calcifications. Mediastinum/Nodes: Ill-defined nodularity throughout the anterior mediastinal fat. This could reflect residual thymic  tissue or small lymph nodes. No discrete mass. Borderline enlarged mediastinal and hilar lymph nodes likely reactive given the lung changes. The esophagus is grossly. Lungs/Pleura: Dense bilateral lower lobe airspace consolidation with air bronchograms consistent with pneumonia. There are also patchy bilateral upper lobe ground-glass opacities and solid-appearing airspace consolidation in the left upper lobe. Small right pleural effusion. Findings most consistent with poly lobar pneumonia. Aspiration would certainly be a consideration. The central tracheobronchial tree is unremarkable. Upper Abdomen: 17 mm low-attenuation right adrenal gland nodule associated with the medial and. This measures -1 Hounsfield units and is consistent with a benign adrenal gland adenoma. No farther imaging evaluation or follow-up is necessary. No hepatic lesions. No upper abdominal adenopathy the. Musculoskeletal: No chest wall mass, supraclavicular or axillary adenopathy. The thyroid gland is unremarkable. The bony thorax is intact. IMPRESSION: 1. Bilateral pulmonary infiltrates consistent with polylobar pneumonia. Aspiration would be a consideration. 2. Borderline enlarged mediastinal and hilar lymph nodes likely reactive given the lung changes. 3. Age advanced three-vessel coronary artery calcifications. 4. 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 5. Ill-defined nodularity throughout the anterior mediastinal fat could reflect residual thymic tissue or small lymph nodes. Recommend follow-up chest CT with contrast in 3-4 months. 6. Aortic atherosclerosis. Aortic Atherosclerosis (ICD10-I70.0). Electronically Signed   By: Mamie Nick.  Gallerani M.D.   On: 02/06/2022 11:45   DG CHEST PORT 1 VIEW  Result Date: 02/06/2022 CLINICAL DATA:  Hypoxia, shortness of breath EXAM: PORTABLE CHEST 1 VIEW COMPARISON:  02/05/2022 FINDINGS: Similar bilateral perihilar opacities, left greater than right. Increased opacification at the left lung base. Stable  cardiomediastinal contours. No significant pleural effusion. No pneumothorax. IMPRESSION: Increased opacification at the left lung base. Similar left greater than right perihilar opacities. May reflect edema or pneumonia. Electronically Signed   By: Macy Mis M.D.   On: 02/06/2022 08:33   ECHOCARDIOGRAM COMPLETE  Result Date: 02/05/2022    ECHOCARDIOGRAM REPORT   Patient Name:   SABIR SPORTSMAN Date of Exam: 02/05/2022 Medical Rec #:  [id number]     Height:       72.0 in Accession #:    [id number]    Weight:       205.0 lb Date of Birth:  [birth year]     BSA:          2.153 m Patient Age:    40 years      BP:           81/64 mmHg Patient Gender: M             HR:           77 bpm. Exam Location:  Inpatient Procedure: 2D Echo, Cardiac Doppler and Color Doppler Indications:    CHF  History:        Patient has no prior history of Echocardiogram examinations.                 Risk Factors:Hypertension, Diabetes and Dyslipidemia.  Sonographer:    Clayton Lefort RDCS (AE) Referring Phys: [place]  1. Left ventricular ejection fraction, by estimation, is 60 to 65%. The left ventricle has normal function. The left ventricle has no regional wall motion abnormalities. Left ventricular diastolic function could not be evaluated. There is the interventricular septum is flattened in systole and diastole, consistent with right ventricular pressure and volume overload.  2. Right ventricular systolic function is normal. The right ventricular size is moderately enlarged. There is mildly elevated pulmonary artery systolic pressure. The estimated right ventricular systolic pressure is 123456 mmHg.  3. The mitral valve is grossly normal. Trivial mitral valve regurgitation. No evidence of mitral stenosis.  4. The aortic valve is tricuspid. Aortic valve regurgitation is not visualized. No aortic stenosis is present.  5. The inferior vena cava is dilated in size with <50% respiratory variability, suggesting right  atrial pressure of 15 mmHg. FINDINGS  Left Ventricle: Left ventricular ejection fraction, by estimation, is 60 to 65%. The left ventricle has normal function. The left ventricle has no regional wall motion abnormalities. The left ventricular internal cavity size was normal in size. There is  no left ventricular hypertrophy. The interventricular septum is flattened in systole and diastole, consistent with right ventricular pressure and volume overload. Left ventricular diastolic function could not be evaluated due to atrial fibrillation. Left ventricular diastolic function could not be evaluated. Right Ventricle: The right ventricular size is moderately enlarged. No increase in right ventricular wall thickness. Right ventricular systolic function is normal. There is mildly elevated pulmonary artery systolic pressure. The tricuspid regurgitant velocity is 2.63 m/s, and with an assumed right atrial pressure of 15 mmHg, the estimated right ventricular systolic pressure is 123456 mmHg. Left Atrium: Left atrial size was normal in size. Right Atrium: Right atrial size was normal in size. Pericardium:  There is no evidence of pericardial effusion. Mitral Valve: The mitral valve is grossly normal. Trivial mitral valve regurgitation. No evidence of mitral valve stenosis. Tricuspid Valve: The tricuspid valve is grossly normal. Tricuspid valve regurgitation is mild . No evidence of tricuspid stenosis. Aortic Valve: The aortic valve is tricuspid. Aortic valve regurgitation is not visualized. No aortic stenosis is present. Aortic valve mean gradient measures 3.0 mmHg. Aortic valve peak gradient measures 5.8 mmHg. Aortic valve area, by VTI measures 2.47 cm. Pulmonic Valve: The pulmonic valve was grossly normal. Pulmonic valve regurgitation is trivial. No evidence of pulmonic stenosis. Aorta: The aortic root and ascending aorta are structurally normal, with no evidence of dilitation. Venous: The inferior vena cava is dilated in size  with less than 50% respiratory variability, suggesting right atrial pressure of 15 mmHg. IAS/Shunts: The atrial septum is grossly normal.  LEFT VENTRICLE PLAX 2D LVIDd:         4.80 cm LVIDs:         3.40 cm LV PW:         1.20 cm LV IVS:        1.00 cm LVOT diam:     2.00 cm LV SV:         51 LV SV Index:   23 LVOT Area:     3.14 cm  RIGHT VENTRICLE RV Basal diam:  4.50 cm RV Mid diam:    5.00 cm RV S prime:     12.20 cm/s TAPSE (M-mode): 2.1 cm LEFT ATRIUM             Index        RIGHT ATRIUM           Index LA diam:        4.70 cm 2.18 cm/m   RA Area:     25.40 cm LA Vol (A2C):   83.2 ml 38.64 ml/m  RA Volume:   76.30 ml  35.44 ml/m LA Vol (A4C):   57.8 ml 26.85 ml/m LA Biplane Vol: 70.8 ml 32.88 ml/m  AORTIC VALVE AV Area (Vmax):    2.50 cm AV Area (Vmean):   2.44 cm AV Area (VTI):     2.47 cm AV Vmax:           120.00 cm/s AV Vmean:          83.800 cm/s AV VTI:            0.205 m AV Peak Grad:      5.8 mmHg AV Mean Grad:      3.0 mmHg LVOT Vmax:         95.60 cm/s LVOT Vmean:        65.100 cm/s LVOT VTI:          0.161 m LVOT/AV VTI ratio: 0.79  AORTA Ao Root diam: 3.50 cm Ao Asc diam:  3.00 cm TRICUSPID VALVE TR Peak grad:   27.7 mmHg TR Vmax:        263.00 cm/s  SHUNTS Systemic VTI:  0.16 m Systemic Diam: 2.00 cm Eleonore Chiquito MD Electronically signed by Eleonore Chiquito MD Signature Date/Time: 02/05/2022/1:53:47 PM    Final    DG Chest Port 1 View  Result Date: 02/05/2022 CLINICAL DATA:  Shortness of breath and sore throat. EXAM: PORTABLE CHEST 1 VIEW COMPARISON:  None Available. FINDINGS: 0620 hours. Bilateral hilar fullness noted. Masslike consolidative opacity is identified in the left mid lung with alveolar opacity at the left base. No substantial pleural  effusion. The cardiopericardial silhouette is within normal limits for size. The visualized bony structures of the thorax are unremarkable. Telemetry leads overlie the chest. IMPRESSION: Masslike consolidative opacity in the left mid lung  with alveolar opacity at the left base. While imaging features may reflect pneumonia, neoplasm is not excluded. Close follow-up recommended and consider CT chest to further evaluate. Electronically Signed   By: Misty Stanley M.D.   On: 02/05/2022 06:59    Microbiology: Results for orders placed or performed during the hospital encounter of 02/05/22  SARS Coronavirus 2 by RT PCR (hospital order, performed in [facility] hospital lab) *cepheid single result test* Anterior Nasal Swab     Status: None   Collection Time: 02/05/22  6:00 AM   Specimen: Anterior Nasal Swab  Result Value Ref Range Status   SARS Coronavirus 2 by RT PCR NEGATIVE NEGATIVE Final    Comment: (NOTE) SARS-CoV-2 target nucleic acids are NOT DETECTED.  The SARS-CoV-2 RNA is generally detectable in upper and lower respiratory specimens during the acute phase of infection. The lowest concentration of SARS-CoV-2 viral copies this assay can detect is 250 copies / mL. A negative result does not preclude SARS-CoV-2 infection and should not be used as the sole basis for treatment or other patient management decisions.  A negative result may occur with improper specimen collection / handling, submission of specimen other than nasopharyngeal swab, presence of viral mutation(s) within the areas targeted by this assay, and inadequate number of viral copies (<250 copies / mL). A negative result must be combined with clinical observations, patient history, and epidemiological information.  Fact Sheet for Patients:   https://www.patel.info/  Fact Sheet for Healthcare Providers: https://hall.com/  This test is not yet approved or  cleared by the [place] FDA and has been authorized for detection and/or diagnosis of SARS-CoV-2 by FDA under an Emergency Use Authorization (EUA).  This EUA will remain in effect (meaning this test can be used) for the duration of the COVID-19 declaration  under Section 564(b)(1) of the Act, 21 U.S.C. section 360bbb-3(b)(1), unless the authorization is terminated or revoked sooner.  Performed at [facility], [address] [place], [place] [address]   Respiratory (~20 pathogens) panel by PCR     Status: None   Collection Time: 02/05/22  7:06 AM   Specimen: Nasopharyngeal Swab; Respiratory  Result Value Ref Range Status   Adenovirus NOT DETECTED NOT DETECTED Final   Coronavirus 229E NOT DETECTED NOT DETECTED Final    Comment: (NOTE) The Coronavirus on the Respiratory Panel, DOES NOT test for the novel  Coronavirus (2019 nCoV)    Coronavirus HKU1 NOT DETECTED NOT DETECTED Final   Coronavirus NL63 NOT DETECTED NOT DETECTED Final   Coronavirus OC43 NOT DETECTED NOT DETECTED Final   Metapneumovirus NOT DETECTED NOT DETECTED Final   Rhinovirus / Enterovirus NOT DETECTED NOT DETECTED Final   Influenza A NOT DETECTED NOT DETECTED Final   Influenza B NOT DETECTED NOT DETECTED Final   Parainfluenza Virus 1 NOT DETECTED NOT DETECTED Final   Parainfluenza Virus 2 NOT DETECTED NOT DETECTED Final   Parainfluenza Virus 3 NOT DETECTED NOT DETECTED Final   Parainfluenza Virus 4 NOT DETECTED NOT DETECTED Final   Respiratory Syncytial Virus NOT DETECTED NOT DETECTED Final   Bordetella pertussis NOT DETECTED NOT DETECTED Final   Bordetella Parapertussis NOT DETECTED NOT DETECTED Final   Chlamydophila pneumoniae NOT DETECTED NOT DETECTED Final   Mycoplasma pneumoniae NOT DETECTED NOT DETECTED Final  Comment: Performed at [facility], [place] [address], [place], [place] [address]  MRSA Next Gen by PCR, Nasal     Status: None   Collection Time: 02/05/22 10:11 AM   Specimen: Nasal Mucosa; Nasal Swab  Result Value Ref Range Status   MRSA by PCR Next Gen NOT DETECTED NOT DETECTED Final    Comment: (NOTE) The GeneXpert MRSA Assay (FDA approved for NASAL specimens only), is one component of a comprehensive MRSA colonization  surveillance program. It is not intended to diagnose MRSA infection nor to guide or monitor treatment for MRSA infections. Test performance is not FDA approved in patients less than 1 years old. Performed at [facility], [place] [address] [place], [place] [address]     Labs: CBC: Recent Labs  Lab 02/05/22 0615 02/05/22 0721 02/06/22 0650 02/07/22 0603 02/08/22 0712  WBC 15.8*  --  16.7* 15.5* 10.1  NEUTROABS 14.2*  --   --   --   --   HGB 14.8 14.6 13.1 11.8* 12.3*  HCT 44.4 43.0 38.4* 34.5* 36.0*  MCV 93.1  --  93.0 93.2 91.8  PLT 231  --  175 168 [id number]   Basic Metabolic Panel: Recent Labs  Lab 02/05/22 0615 02/05/22 0721 02/06/22 0650 02/07/22 0603 02/08/22 0712  NA 140 140 138 137 139  K 4.2 4.0 3.9 3.5 3.5  CL 106  --  109 104 105  CO2 27  --  22 25 27   GLUCOSE 206*  --  157* 138* 149*  BUN 19  --  15 14 15   CREATININE 1.29*  --  1.23 1.17 1.32*  CALCIUM 8.5*  --  8.3* 8.2* 8.5*  MG  --   --  1.8 1.7 2.1  PHOS  --   --  2.1* 2.3*  --    Liver Function Tests: No results for input(s): "AST", "ALT", "ALKPHOS", "BILITOT", "PROT", "ALBUMIN" in the last 168 hours. CBG: Recent Labs  Lab 02/07/22 1158 02/07/22 1619 02/07/22 2113 02/08/22 0624 02/08/22 1120  GLUCAP 164* 154* 140* 150* 201*    Discharge time spent: greater than 30 minutes.  Signed: Alma Friendly, MD [facility] 02/09/2022

## 2023-03-05 NOTE — Progress Notes (Signed)
 [facility] Office Note   03/05/2023  PCP:  Peter DELENA Blunt, MD  Referring Provider:  VEAR DELENA Blunt, MD   Portions of this note were dictated using DRAGON voice recognition software. Please disregard any errors in transcription.  This record has been created using Conservation officer, historic buildings. Errors have been sought and corrected, but may not always be located. Such creation errors do not reflect on the standard of medical care.  Patient ID: Peter Robinson is a 67 y.o. male.  Chief Complaint:  Chief Complaint  Patient presents with  . Follow-up    Follow up    Assessment and Plan:    1. 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 Longstanding persistent atrial fibrillation (HCC) - Transthoracic echo (TTE) complete; Future  3. Current use of long term anticoagulation-Eliquis  for afib  4. Essential hypertension  5. Dyslipidemia  6. Chest discomfort - Stress test with myocardial perfusion; Future   Return in about 3 months (around 06/05/2023).   PLAN:      [name] is overall doing fairly well.  He is having some issues however.  Today, he inquired about the possibility of restoring sinus rhythm.  He has been in atrial fibrillation prolonged period of time.  I began seeing him several years ago and at that point he had been in atrial fibrillation for a number of years.  I would like him to have an echocardiac at this time to assess his left ventricular systolic function.  This will also get some assessment of his left atrial size and follow-up on his remote diagnosis of mitral valve prolapse.  Depending on the findings of the echocardiogram, we will consider referral to one of the electrophysiologist to consider him for restoration of sinus rhythm.  He continues on anticoagulation with Eliquis  for stroke prophylaxis and has had no bleeding issues.  He is describing some chest tightness mainly waking him up at  night on occasion.  He is concerned about the possibility of coronary disease.  His cardiac catheterization in 2021 showed mild nonobstructive CAD.  I would like him to have a Lexiscan sestamibi study at this time to assess for any evidence of significant ischemia.  His blood pressure seems to be well-controlled on his current antihypertensive regimen.  He sees Peter Robinson who manages his cholesterol and blood pressure.  He is having issues with his sleep apnea.  He tried CPAP therapy but did not think it seemed to make much difference and is not using it currently but has a follow-up appointment with his sleep specialist soon to discuss this further.  I will see him back in 3 months, sooner should we find some significant abnormality on testing.   Subjective:    Peter Robinson returns for further evaluation and management of his/her cardiac issues.  He is a 67 year old male with a history of longstanding persistent atrial fibrillation, hypertension and dyslipidemia.  I began seeing him several years ago and he had been in atrial fibrillation for an unknown period of time prior to that visit.  He was asymptomatic.  We opted to continue with the strategy of rate control and anticoagulation and he has continued on that treatment.  In 2021 he was describing some chest discomfort.  A myocardial perfusion scan suggested transient ischemic dilatation.  He underwent a cardiac catheterization demonstrating mild nonobstructive coronary artery disease with normal left ventricular systolic function.  He really managed well after  that without any recurrent symptoms.  I last saw him in the office about 15 months ago.  At that point he was doing well and overall has continued to do well.  He generally stays very active but more recently has been experiencing some episodes where he is awakened from sleep with chest tightness.  The episodes are somewhat random but are very uncomfortable when he experiences  the episodes.  He continues having difficulties with sleep.  He sees a sleep specialist and tried CPAP therapy for sleep apnea but did not think it made much difference.  He continues on anticoagulation with Eliquis  for stroke prophylaxis and has had no bleeding issues.  He still sees Dr. Marsa for management of his blood pressure and cholesterol.  Review of Systems  Pertinent items are noted in HPI.   Medical History  Patient Active Problem List  Diagnosis  . Palpitations  . Essential hypertension  . Dyslipidemia  . Longstanding persistent atrial fibrillation (HCC)  . Current use of long term anticoagulation-Eliquis  for afib  . Chest discomfort  . Abnormal myocardial perfusion study  . OSA (obstructive sleep apnea)  . 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 Seborrheic keratoses  . Sun-damaged skin  . Cherry angioma  . Multiple benign nevi  . Folliculitis    Social History  Social History   Tobacco Use  Smoking Status Former  . Current packs/day: 0.00  . Types: Cigarettes  . Quit date: 11/22/2017  . Years since quitting: 5.2  Smokeless Tobacco Never    Medications  Current Outpatient Medications  Medication Sig Dispense Refill  . amLODIPine  (NORVASC ) 5 mg tablet Take 5 mg by mouth Once Daily. 60 tablet 6  . apixaban  (Eliquis ) 5 mg tab Take 5 mg by mouth 2 (two) times a day. 180 tablet 1  . atorvastatin  (LIPITOR) 40 mg tablet Take 40 mg by mouth Once Daily.    . carvediloL  (COREG ) 12.5 mg tablet Take 12.5 mg by mouth 2 (two) times a day.    . clindamycin (CLEOCIN T) 1 % solution APPLY SMALL AMOUNT TO BEARD AREA DAILY FOR BUMPS 60 mL 1  . doxazosin (CARDURA) 2 mg tablet Take 2 mg by mouth.  0  . doxycycline (VIBRAMYCIN) 50 mg cap TAKE 1 CAPSULE IN THE EVENING 30 MINUTES AFTER DINNER 30 capsule 1  . furosemide  (LASIX ) 10 mg/mL solution Take 0.5 mg/kg by mouth Once Daily.    . losartan  (COZAAR ) 100 mg tablet Take 100 mg by mouth Once  Daily.    . metFORMIN (GLUCOPHAGE) 500 mg tablet Take 1,000 mg by mouth daily with breakfast.    . [place] FlexTouch U-100 100 unit/mL (3 mL) pen Inject under the skin daily.    SABRA tretinoin (Retin-A) 0.025 % cream Apply pea-sized amount to entire face nightly. If too drying, apply every other night or every 3 nights. 45 g 5  . glipiZIDE (GLUCOTROL XL) 10 mg 24 hr tablet Take 10 mg by mouth Once Daily. (Patient not taking: Reported on 03/05/2023)     No current facility-administered medications for this visit.    Allergies  No Known Allergies   Objective:    Vital Signs  BP 138/72   Pulse 99   Ht 1.829 m (6')   Wt 94.3 kg (208 lb)   SpO2 96%   BMI 28.21 kg/m  Body mass index is 28.21 kg/m.  Wt Readings from Last 3 Encounters:  03/05/23 94.3 kg (208 lb)  09/03/22 90.7  kg (200 lb)  12/18/21 96.2 kg (212 lb)    Physical Exam  Constitutional: well developed  male seated comfortably in the exam room. EENT:   Eyes appear normal. Ears appear normal, normal nose Neck: Supple without LAD, TM, JVD, or HJR. Respiratory: CTA bilaterally with normal WOB. Cardiovascular: Irregular RR normal S1 and S2 without m/r/g. Abdominal: NABS.  Soft, NT/ND without HSM. Extremities: 2+ radial, PT, and DP pulses bilaterally.  No LE edema. Neurologic: No gross abnormalities Musculoskeletal:  Walks with a normal gait   Lab Review   HX SODIUM  Date/Time Value Ref Range Status  11/16/2019 03:24 PM 144 135 - 146 MMOL/L Final   HX POTASSIUM  Date/Time Value Ref Range Status  11/16/2019 03:24 PM 4.1 3.5 - 5.3 MMOL/L Final   HX BUN  Date/Time Value Ref Range Status  11/16/2019 03:24 PM 17 8 - 24 MG/DL Final   HX CREATININE  Date/Time Value Ref Range Status  11/16/2019 03:24 PM 1.33 0.50 - 1.50 MG/DL Final   HX HGB  Date/Time Value Ref Range Status  11/16/2019 03:24 PM 14.3 14.0 - 17.5 G/DL Final   HX HCT  Date/Time Value Ref Range Status  11/16/2019 03:24 PM 41.4 (L) 41.5 - 50.4 %  Final   HX PLT  Date/Time Value Ref Range Status  11/16/2019 03:24 PM 250 150 - 450 X 10*3/uL Final     The ASCVD Risk score (Arnett DK, et al., 2019) failed to calculate for the following reasons:   Cannot find a previous HDL lab   Cannot find a previous total cholesterol lab   Debby Isla Manner, MD MD [facility]

## 2024-03-18 ENCOUNTER — Inpatient Hospital Stay (HOSPITAL_BASED_OUTPATIENT_CLINIC_OR_DEPARTMENT_OTHER)
Admission: EM | Admit: 2024-03-18 | Discharge: 2024-03-19 | DRG: 065 | Disposition: A | Discharge status: Home / Self Care | Attending: Family Medicine | Admitting: Family Medicine

## 2024-03-18 ENCOUNTER — Emergency Department (HOSPITAL_BASED_OUTPATIENT_CLINIC_OR_DEPARTMENT_OTHER)

## 2024-03-18 ENCOUNTER — Other Ambulatory Visit: Payer: Self-pay

## 2024-03-18 ENCOUNTER — Encounter (HOSPITAL_BASED_OUTPATIENT_CLINIC_OR_DEPARTMENT_OTHER): Payer: Self-pay

## 2024-03-18 DIAGNOSIS — E1140 Type 2 diabetes mellitus with diabetic neuropathy, unspecified: Secondary | ICD-10-CM | POA: Diagnosis present

## 2024-03-18 DIAGNOSIS — Z91128 Patient's intentional underdosing of medication regimen for other reason: Secondary | ICD-10-CM

## 2024-03-18 DIAGNOSIS — Z66 Do not resuscitate: Secondary | ICD-10-CM | POA: Diagnosis present

## 2024-03-18 DIAGNOSIS — I4821 Permanent atrial fibrillation: Secondary | ICD-10-CM | POA: Diagnosis present

## 2024-03-18 DIAGNOSIS — I2510 Atherosclerotic heart disease of native coronary artery without angina pectoris: Secondary | ICD-10-CM | POA: Diagnosis present

## 2024-03-18 DIAGNOSIS — E785 Hyperlipidemia, unspecified: Secondary | ICD-10-CM | POA: Diagnosis present

## 2024-03-18 DIAGNOSIS — Z9842 Cataract extraction status, left eye: Secondary | ICD-10-CM | POA: Diagnosis not present

## 2024-03-18 DIAGNOSIS — Z87891 Personal history of nicotine dependence: Secondary | ICD-10-CM | POA: Diagnosis not present

## 2024-03-18 DIAGNOSIS — Z794 Long term (current) use of insulin: Secondary | ICD-10-CM

## 2024-03-18 DIAGNOSIS — Z9841 Cataract extraction status, right eye: Secondary | ICD-10-CM

## 2024-03-18 DIAGNOSIS — G4730 Sleep apnea, unspecified: Secondary | ICD-10-CM | POA: Diagnosis present

## 2024-03-18 DIAGNOSIS — Z7901 Long term (current) use of anticoagulants: Secondary | ICD-10-CM | POA: Diagnosis not present

## 2024-03-18 DIAGNOSIS — I10 Essential (primary) hypertension: Secondary | ICD-10-CM | POA: Diagnosis present

## 2024-03-18 DIAGNOSIS — R29702 NIHSS score 2: Secondary | ICD-10-CM | POA: Diagnosis present

## 2024-03-18 DIAGNOSIS — Z91148 Patient's other noncompliance with medication regimen for other reason: Secondary | ICD-10-CM | POA: Diagnosis not present

## 2024-03-18 DIAGNOSIS — Z7982 Long term (current) use of aspirin: Secondary | ICD-10-CM | POA: Diagnosis not present

## 2024-03-18 DIAGNOSIS — Z7984 Long term (current) use of oral hypoglycemic drugs: Secondary | ICD-10-CM | POA: Diagnosis not present

## 2024-03-18 DIAGNOSIS — R4701 Aphasia: Secondary | ICD-10-CM | POA: Diagnosis present

## 2024-03-18 DIAGNOSIS — I639 Cerebral infarction, unspecified: Secondary | ICD-10-CM

## 2024-03-18 DIAGNOSIS — E119 Type 2 diabetes mellitus without complications: Secondary | ICD-10-CM

## 2024-03-18 DIAGNOSIS — T45516A Underdosing of anticoagulants, initial encounter: Secondary | ICD-10-CM | POA: Diagnosis not present

## 2024-03-18 DIAGNOSIS — I6389 Other cerebral infarction: Secondary | ICD-10-CM | POA: Diagnosis not present

## 2024-03-18 DIAGNOSIS — I4891 Unspecified atrial fibrillation: Secondary | ICD-10-CM | POA: Diagnosis not present

## 2024-03-18 DIAGNOSIS — G459 Transient cerebral ischemic attack, unspecified: Secondary | ICD-10-CM | POA: Diagnosis present

## 2024-03-18 DIAGNOSIS — R29703 NIHSS score 3: Secondary | ICD-10-CM | POA: Diagnosis not present

## 2024-03-18 DIAGNOSIS — I708 Atherosclerosis of other arteries: Secondary | ICD-10-CM | POA: Diagnosis present

## 2024-03-18 DIAGNOSIS — I63412 Cerebral infarction due to embolism of left middle cerebral artery: Principal | ICD-10-CM | POA: Diagnosis present

## 2024-03-18 LAB — CBC
HCT: 43.9 % (ref 39.0–52.0)
Hemoglobin: 14.8 g/dL (ref 13.0–17.0)
MCH: 30.8 pg (ref 26.0–34.0)
MCHC: 33.7 g/dL (ref 30.0–36.0)
MCV: 91.5 fL (ref 80.0–100.0)
Platelets: 261 K/uL (ref 150–400)
RBC: 4.8 MIL/uL (ref 4.22–5.81)
RDW: 12.7 % (ref 11.5–15.5)
WBC: 10.2 K/uL (ref 4.0–10.5)
nRBC: 0 % (ref 0.0–0.2)

## 2024-03-18 LAB — COMPREHENSIVE METABOLIC PANEL WITH GFR
ALT: 23 U/L (ref 0–44)
AST: 31 U/L (ref 15–41)
Albumin: 4.1 g/dL (ref 3.5–5.0)
Alkaline Phosphatase: 85 U/L (ref 38–126)
Anion gap: 13 (ref 5–15)
BUN: 16 mg/dL (ref 8–23)
CO2: 26 mmol/L (ref 22–32)
Calcium: 9.4 mg/dL (ref 8.9–10.3)
Chloride: 106 mmol/L (ref 98–111)
Creatinine, Ser: 1.24 mg/dL (ref 0.61–1.24)
GFR, Estimated: >60 mL/min (ref >60)
Glucose, Bld: 176 mg/dL — ABNORMAL HIGH (ref 70–99)
Potassium: 3.8 mmol/L (ref 3.5–5.1)
Sodium: 144 mmol/L (ref 135–145)
Total Bilirubin: 0.7 mg/dL (ref 0.0–1.2)
Total Protein: 6.8 g/dL (ref 6.5–8.1)

## 2024-03-18 LAB — APTT: aPTT: 28 s (ref 24–36)

## 2024-03-18 LAB — DIFFERENTIAL
Abs Immature Granulocytes: 0.07 K/uL (ref 0.00–0.07)
Basophils Absolute: 0.1 K/uL (ref 0.0–0.1)
Basophils Relative: 1 %
Eosinophils Absolute: 0.1 K/uL (ref 0.0–0.5)
Eosinophils Relative: 1 %
Immature Granulocytes: 1 %
Lymphocytes Relative: 9 %
Lymphs Abs: 0.9 K/uL (ref 0.7–4.0)
Monocytes Absolute: 0.7 K/uL (ref 0.1–1.0)
Monocytes Relative: 7 %
Neutro Abs: 8.4 K/uL — ABNORMAL HIGH (ref 1.7–7.7)
Neutrophils Relative %: 81 %

## 2024-03-18 LAB — CBG MONITORING, ED: Glucose-Capillary: 204 mg/dL — ABNORMAL HIGH (ref 70–99)

## 2024-03-18 LAB — PROTIME-INR
INR: 1 (ref 0.8–1.2)
Prothrombin Time: 13.3 s (ref 11.4–15.2)

## 2024-03-18 MED ORDER — IOHEXOL 350 MG/ML SOLN
50.0000 mL | Freq: Once | INTRAVENOUS | Status: AC | PRN
  Administered 2024-03-18: 50 mL via INTRAVENOUS

## 2024-03-18 MED ORDER — CARVEDILOL 12.5 MG PO TABS
12.5000 mg | ORAL_TABLET | Freq: Two times a day (BID) | ORAL | Status: DC
  Administered 2024-03-18 – 2024-03-19 (×2): 12.5 mg via ORAL
  Filled 2024-03-18 (×2): qty 1

## 2024-03-18 MED ORDER — STROKE: EARLY STAGES OF RECOVERY BOOK
Freq: Once | Status: AC
  Filled 2024-03-18: qty 1

## 2024-03-18 MED ORDER — ATORVASTATIN CALCIUM 40 MG PO TABS
40.0000 mg | ORAL_TABLET | Freq: Every day | ORAL | Status: DC
  Administered 2024-03-18 – 2024-03-19 (×2): 40 mg via ORAL
  Filled 2024-03-18 (×2): qty 1

## 2024-03-18 MED ORDER — CLOPIDOGREL BISULFATE 75 MG PO TABS: 75.0000 mg | ORAL_TABLET | Freq: Every day | ORAL | Status: DC

## 2024-03-18 MED ORDER — SODIUM CHLORIDE 0.9% FLUSH
3.0000 mL | Freq: Two times a day (BID) | INTRAVENOUS | Status: DC
  Administered 2024-03-18 – 2024-03-19 (×2): 3 mL via INTRAVENOUS

## 2024-03-18 MED ORDER — CLOPIDOGREL BISULFATE 75 MG PO TABS
300.0000 mg | ORAL_TABLET | Freq: Once | ORAL | Status: AC
  Administered 2024-03-19: 300 mg via ORAL
  Filled 2024-03-18: qty 4

## 2024-03-18 MED ORDER — ASPIRIN 325 MG PO TBEC
325.0000 mg | DELAYED_RELEASE_TABLET | Freq: Once | ORAL | Status: AC
Start: 2024-03-18 — End: 2024-03-18
  Administered 2024-03-18: 325 mg via ORAL
  Filled 2024-03-18: qty 1

## 2024-03-18 MED ORDER — GABAPENTIN 300 MG PO CAPS
600.0000 mg | ORAL_CAPSULE | Freq: Two times a day (BID) | ORAL | Status: DC
  Administered 2024-03-18: 600 mg via ORAL
  Filled 2024-03-18 (×2): qty 2

## 2024-03-18 MED ORDER — INSULIN ASPART 100 UNIT/ML IJ SOLN
0.0000 [IU] | Freq: Three times a day (TID) | INTRAMUSCULAR | Status: DC
  Administered 2024-03-19 (×2): 1 [IU] via SUBCUTANEOUS

## 2024-03-18 NOTE — ED Triage Notes (Signed)
 Pt arrives via POV to the ED. Pt alert to name. Pt is unable to find words to express what is going on. Called ED provider to triage. Pt did drive to ED. States that around 1pm yesterday he started having some speech defects. EDP in triage. Pt having difficulty with finding words.

## 2024-03-18 NOTE — ED Notes (Signed)
 Pt wrote his name without incident, but was unable to spell other simple words. Also had issues recalling letters, and when prompted to write letters like t and s he would pick different letters like m and r and then say the incorrect letter.

## 2024-03-18 NOTE — ED Provider Notes (Signed)
 [place] EMERGENCY DEPARTMENT AT [facility] Provider Note   CSN: [id number] Arrival date & time: 03/18/24  1409     Patient presents with: Altered Mental Status   Peter Robinson is a 68 y.o. male who drove himself to [facility] emergency department for evaluation of speech difficulties.  I was asked to see the patient at triage.  He is having significant expressive and moderate receptive aphasia.  The patient was finally able to relay that his symptoms started at 1 PM yesterday and have been persistent since that time.  He is unable to give history very easily as he is having a lot of difficulty speaking.  He has a past medical history of atrial fibrillation diabetes and hypertension.  His chart states that he is on Eliquis .  Patient reports that he has not been taking his Eliquis  for his A-fib.      Altered Mental Status      Prior to Admission medications   Medication Sig Start Date End Date Taking? Authorizing Provider  amLODipine  (NORVASC ) 2.5 MG tablet Take 2.5 mg by mouth daily. 12/28/21   [provider]  atorvastatin  (LIPITOR) 40 MG tablet Take 40 mg by mouth daily. 11/21/21   [provider]  carvedilol  (COREG ) 12.5 MG tablet Take 12.5 mg by mouth 2 (two) times daily. 11/21/21   [provider]  cyclobenzaprine (FLEXERIL) 10 MG tablet Take 5-10 mg by mouth 3 (three) times daily as needed for muscle spasms. 10/10/21   [provider]  doxazosin (CARDURA) 2 MG tablet Take 2 mg by mouth daily. 11/21/21   [provider]  ELIQUIS  5 MG TABS tablet Take 5 mg by mouth 2 (two) times daily. 08/10/21   [provider]  furosemide  (LASIX ) 20 MG tablet Take 20 mg by mouth daily. 01/23/22   [provider]  gabapentin  (NEURONTIN ) 300 MG capsule Take 900 mg by mouth 2 (two) times daily. 01/12/22   [provider]  glipiZIDE (GLUCOTROL XL) 10 MG 24 hr tablet Take 20 mg by mouth daily with breakfast.  12/28/21   [provider]  losartan  (COZAAR ) 100 MG tablet Take 100 mg by mouth daily. 11/21/21   [provider]  metFORMIN (GLUCOPHAGE-XR) 500 MG 24 hr tablet Take 500 mg by mouth daily. 11/30/21   [provider]  predniSONE (DELTASONE) 20 MG tablet Take 20 mg by mouth daily as needed. 01/11/22   [provider]    Allergies: Patient has no known allergies.    Review of Systems  Updated Vital Signs BP (!) 131/120   Pulse 99   Temp 97.9 F (36.6 C)   Resp 20   SpO2 97%   Physical Exam Vitals and nursing note reviewed.  Constitutional:      General: He is not in acute distress.    Appearance: He is well-developed. He is not diaphoretic.  HENT:     Head: Normocephalic and atraumatic.  Eyes:     General: No scleral icterus.    Conjunctiva/sclera: Conjunctivae normal.  Cardiovascular:     Rate and Rhythm: Normal rate and regular rhythm.     Heart sounds: Normal heart sounds.  Pulmonary:     Effort: Pulmonary effort is normal. No respiratory distress.     Breath sounds: Normal breath sounds.  Abdominal:     Palpations: Abdomen is soft.     Tenderness: There is no abdominal tenderness.  Musculoskeletal:     Cervical back: Normal range of  motion and neck supple.  Skin:    General: Skin is warm and dry.  Neurological:     Mental Status: He is alert.     GCS: GCS eye subscore is 4. GCS verbal subscore is 5. GCS motor subscore is 6.     Cranial Nerves: Cranial nerves 2-12 are intact.     Sensory: Sensation is intact.     Motor: Motor function is intact. No weakness, tremor, abnormal muscle tone or pronator drift.     Coordination: Coordination is intact.     Gait: Gait is intact.     Deep Tendon Reflexes:     Reflex Scores:      Patellar reflexes are 2+ on the right side and 2+ on the left side.    Comments: Patient has obvious expressive aphasia.  He has receptive aphasia as well and sometimes has difficulty following commands or following  more than 1 command at the time.  However he seems to be able to process it if repeated several times in a row.  Psychiatric:        Behavior: Behavior normal.     (all labs ordered are listed, but only abnormal results are displayed) Labs Reviewed  DIFFERENTIAL - Abnormal; Notable for the following components:      Result Value   Neutro Abs 8.4 (*)    All other components within normal limits  COMPREHENSIVE METABOLIC PANEL WITH GFR - Abnormal; Notable for the following components:   Glucose, Bld 176 (*)    All other components within normal limits  CBG MONITORING, ED - Abnormal; Notable for the following components:   Glucose-Capillary 204 (*)    All other components within normal limits  PROTIME-INR  APTT  CBC  URINE DRUG SCREEN    EKG: None  Radiology: CT ANGIO HEAD NECK W WO CM Result Date: 03/18/2024 EXAM: CTA HEAD AND NECK WITH AND WITHOUT 03/18/2024 04:39:20 PM TECHNIQUE: CTA of the head and neck was performed with and without the administration of 50 mL iohexol (OMNIPAQUE) 350 MG/ML injection. Multiplanar 2D and/or 3D reformatted images are provided for review. Automated exposure control, iterative reconstruction, and/or weight based adjustment of the mA/kV was utilized to reduce the radiation dose to as low as reasonably achievable. Stenosis of the internal carotid arteries measured using NASCET criteria. COMPARISON: None available CLINICAL HISTORY: Neuro deficit, acute, stroke suspected. Neuro deficit, aphasic. FINDINGS: CTA NECK: AORTIC ARCH AND ARCH VESSELS: Atherosclerosis of the visualized aortic arch. 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 Atherosclerosis at the origin of the left subclavian artery without stenosis. No dissection or arterial injury. CERVICAL CAROTID ARTERIES: The right carotid artery is patent from the origin to the skull base. Atherosclerosis at the right carotid bifurcation without hemodynamically  significant stenosis. Mild tortuosity of the right distal cervical ICA. The left carotid artery is patent from the origin to the skull base. Atherosclerosis at the left carotid bifurcation without hemodynamically significant stenosis. No dissection or arterial injury. CERVICAL VERTEBRAL ARTERIES: The vertebral arteries are patent from the origins to the vertebrobasilar confluence. The left vertebral artery is dominant. Atherosclerosis of the left V4 segment without stenosis. No dissection or arterial injury. LUNGS AND MEDIASTINUM: Unremarkable. SOFT TISSUES: No acute abnormality. BONES: Changes throughout the visualized spine. No acute abnormality. CTA HEAD: ANTERIOR CIRCULATION: The intracranial internal carotid arteries are patent bilaterally. There is mild atherosclerosis of the carotid siphons. There is mild stenosis of the right paraclinoid right ICA. The  right MCA is patent. The left M1 and proximal M2 segments are patent. There is occlusion of a small proximal M3 branch of the left MCA, age indeterminate. (603 image [id number]) The anterior cerebral arteries are patent bilaterally. Azygos configuration of the anterior cerebral arteries. No aneurysm. POSTERIOR CIRCULATION: No significant stenosis of the posterior cerebral arteries. No significant stenosis of the basilar artery. No significant stenosis of the vertebral arteries. No aneurysm. OTHER: Mild chronic microvascular ischemic changes. Bilateral lens replacement. No dural venous sinus thrombosis on this non-dedicated study. IMPRESSION: 1. Occlusion of a small proximal M3 branch of the left MCA. 2. Mild stenosis of the right paraclinoid ICA. 3. 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 4. Mild chronic microvascular ischemic changes. Electronically signed by: Donnice Mania MD 03/18/2024 04:58 PM EDT RP Workstation: [id number]     .Critical Care  Performed by: Arloa Chroman, PA-C Authorized by: Arloa Chroman, PA-C   Critical care provider statement:    Critical care time (minutes):  60   Critical care time was exclusive of:  Separately billable procedures and treating other patients   Critical care was necessary to treat or prevent imminent or life-threatening deterioration of the following conditions:  CNS failure or compromise   Critical care was time spent personally by me on the following activities:  Development of treatment plan with patient or surrogate, discussions with consultants, evaluation of patient's response to treatment, examination of patient, ordering and review of laboratory studies, ordering and review of radiographic studies, ordering and performing treatments and interventions, pulse oximetry, re-evaluation of patient's condition, review of old charts, interpretation of cardiac output measurements and obtaining history from patient or surrogate   Care discussed with: admitting provider      Medications Ordered in the ED  iohexol (OMNIPAQUE) 350 MG/ML injection 50 mL (50 mLs Intravenous Contrast Given 03/18/24 1626)    Clinical Course as of 03/20/24 1104  Thu Mar 18, 2024  1427 Patient here with suspected stroke.  Case discussed with Dr. Matthews [AH]  [phone] ED EKG EKG shows rate controlled A-fib at a rate of 90. [AH]  1512 At bedside the patient was having some difficulty figuring out where his contacts were on his phone however we were able to pull them up and he was able to locate his sister who has been informed and is on her way to come be at bedside [AH]  1513 Since speech difficulty seems to be waxing and waning he seems to have some improvement since arriving here [AH]  1517 Patient also expressing dysgraphia.  He is able to write his name however is mixing up letters when asked to write single letters and cannot write the word hospital.. [AH]  1706 I received a call from Dr. Mania.  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  I was also updated  by the patient's nurse that his initial onset of symptoms may have had been closer to 5 PM yesterday.  Discussed the case again with Dr. Elida Matthews who states that this would still preclude him from any kind of intervention such as thrombectomy. [AH]    Clinical Course User Index [AH] Arloa Chroman, PA-C                                 Medical Decision Making This is a 68 year old male who presents to the emergency department for  onset of aphasia greater than 24 hours ago. 's are consistent with acute stroke. Is not seem to have any other cause such as bulbar muscle weakness or dysfunction or dysarthria. I ordered labs which show glucose of 176, otherwise no significant findings.  I ordered an EKG which shows rate controlled atrial fibrillation at a rate of 90.   I ordered visualized interpreted and discussed findings of CT angiogram of the head and neck which shows an acute M3 occlusion on the left which would certainly be consistent with his symptoms of aphasia.  Patient appears to have acute embolic stroke.  I have not started him on any anticoagulation at this time given his acute stroke.  Patient will be admitted to the hospitalist service.  The neurology team is aware and will consult and make recommendations.  Amount and/or Complexity of Data Reviewed Labs: ordered. Radiology: ordered. ECG/medicine tests:  Decision-making details documented in ED Course.  Risk OTC drugs. Prescription drug management. Decision regarding hospitalization.        Final diagnoses:  Cerebrovascular accident (CVA) due to embolism of left middle cerebral artery [facility])    ED Discharge Orders     None          Arloa Chroman, PA-C 03/20/24 1104    Rogelia Jerilynn RAMAN, MD 03/28/24 1725

## 2024-03-18 NOTE — ED Notes (Signed)
 Pt advised he was normal at 1700hrs yesterday and noticed an acute onset of symptoms. He was reading his phone and realized none of the words made sense on the screen. He woke up and cleaned his house because he could tell something was wrong but did not know he had had a stroke.

## 2024-03-18 NOTE — H&P (Signed)
 History and Physical    Peter Robinson FMW:[id number] DOB: Apr 22, 1956 DOA: 03/18/2024  PCP: Marsa Miquel Faden, MD   Patient coming from: Tx from [facility] ED    Chief Complaint:  Chief Complaint  Patient presents with   Altered Mental Status    HPI:  Peter Robinson is a 68 y.o. male with hx of longstanding persistent Afib on AC, nonobstructive CAD, DM2, HTN, HLD, who is transferred from [facility] ED for suspected stroke with L M3 occlusion in setting of his Afib and nonadherence with [facility]  He had presented with symptoms of aphasia, with LKW 10/22 per patient in the evening before going to sleep although was not able to specify exact time of onset of symptoms; states speech issues were present before going to sleep. Per EDP was ~ 5PM, but he is at least able to say later in the evening than this. Noted that he was unable to read and understand language. And also with difficulty with getting words out. He has an associated headache, global, since last night. He has no vision change, numbness / weakness. He acknowledges nonadherence with apixaban  without any complications like bleeding in the past. Stopped taking on his own, and switched to aspirin 81 mg daily.   Review of Systems:  ROS complete and negative except as marked above   No Known Allergies  Prior to Admission medications   Medication Sig Start Date End Date Taking? Authorizing Provider  aspirin EC 81 MG tablet Take 81 mg by mouth daily. Swallow whole.   Yes [provider]  amLODipine  (NORVASC ) 2.5 MG tablet Take 2.5 mg by mouth daily. 12/28/21   [provider]  atorvastatin  (LIPITOR) 40 MG tablet Take 40 mg by mouth daily. 11/21/21   [provider]  carvedilol  (COREG ) 12.5 MG tablet Take 12.5 mg by mouth 2 (two) times daily. 11/21/21   [provider]  cyclobenzaprine (FLEXERIL) 10 MG tablet Take 5-10 mg by mouth 3 (three) times daily as needed for muscle spasms. 10/10/21   [provider]   doxazosin (CARDURA) 2 MG tablet Take 2 mg by mouth daily. 11/21/21   [provider]  ELIQUIS  5 MG TABS tablet Take 5 mg by mouth 2 (two) times daily. 08/10/21   [provider]  furosemide  (LASIX ) 20 MG tablet Take 20 mg by mouth daily. 01/23/22   [provider]  gabapentin  (NEURONTIN ) 300 MG capsule Take 600 mg by mouth 2 (two) times daily. Take 1 to 2 tablets by mouth at bedtime. 01/12/22   [provider]  glipiZIDE (GLUCOTROL XL) 10 MG 24 hr tablet Take 5 mg by mouth daily with breakfast. 12/28/21   [provider]  losartan  (COZAAR ) 100 MG tablet Take 100 mg by mouth daily. 11/21/21   [provider]  metFORMIN (GLUCOPHAGE-XR) 500 MG 24 hr tablet Take 500 mg by mouth daily. 11/30/21   [provider]  predniSONE (DELTASONE) 20 MG tablet Take 20 mg by mouth daily as needed. 01/11/22   [provider]    Past Medical History:  Diagnosis Date   Atrial fibrillation (HCC)    Diabetes mellitus without complication (HCC)    Hypertension     Past Surgical History:  Procedure Laterality Date   CATARACT EXTRACTION Bilateral      reports that he has quit smoking. His smoking use included cigarettes. He has never used smokeless tobacco. He reports current alcohol use. He reports that he does not use drugs.  History reviewed. No  pertinent family history.   Physical Exam: Vitals:   03/18/24 1659 03/18/24 1804 03/18/24 1805 03/18/24 2105  BP:   (!) 177/105 (!) 161/90  Pulse: 89   62  Resp:    19  Temp:  97.9 F (36.6 C)  97.9 F (36.6 C)  TempSrc:  Oral  Oral  SpO2: 97%   96%    Gen: Awake, alert, NAD   CV: Regular, normal S1, S2, no murmurs  Resp: Normal WOB, CTAB  Abd: Flat, normoactive, nontender MSK: Symmetric, no edema  Skin: No rashes or lesions to exposed skin  Neuro: Alert and interactive, there is expressive aphasia, intact comprehension, impaired repetition. CN 2-12 intact, motor is 5/5 and symmetric and  sensation is intact and equal to fine touch  Psych: euthymic, appropriate    Data review:   Labs reviewed, notable for:   BG 176  Other chemistries, blood counts, unremarkable   Micro:  Results for orders placed or performed during the hospital encounter of 02/05/22  SARS Coronavirus 2 by RT PCR (hospital order, performed in [facility] hospital lab) *cepheid single result test* Anterior Nasal Swab     Status: None   Collection Time: 02/05/22  6:00 AM   Specimen: Anterior Nasal Swab  Result Value Ref Range Status   SARS Coronavirus 2 by RT PCR NEGATIVE NEGATIVE Final    Comment: (NOTE) SARS-CoV-2 target nucleic acids are NOT DETECTED.  The SARS-CoV-2 RNA is generally detectable in upper and lower respiratory specimens during the acute phase of infection. The lowest concentration of SARS-CoV-2 viral copies this assay can detect is 250 copies / mL. A negative result does not preclude SARS-CoV-2 infection and should not be used as the sole basis for treatment or other patient management decisions.  A negative result may occur with improper specimen collection / handling, submission of specimen other than nasopharyngeal swab, presence of viral mutation(s) within the areas targeted by this assay, and inadequate number of viral copies (<250 copies / mL). A negative result must be combined with clinical observations, patient history, and epidemiological information.  Fact Sheet for Patients:   RoadLapTop.co.za  Fact Sheet for Healthcare Providers: http://kim-miller.com/  This test is not yet approved or  cleared by the [place]  FDA and has been authorized for detection and/or diagnosis of SARS-CoV-2 by FDA under an Emergency Use Authorization (EUA).  This EUA will remain in effect (meaning this test can be used) for the duration of the COVID-19 declaration under Section 564(b)(1) of the Act, 21 U.S.C. section 360bbb-3(b)(1), unless  the authorization is terminated or revoked sooner.  Performed at [facility], [address] [place], [place] [address]   Respiratory (~20 pathogens) panel by PCR     Status: None   Collection Time: 02/05/22  7:06 AM   Specimen: Nasopharyngeal Swab; Respiratory  Result Value Ref Range Status   Adenovirus NOT DETECTED NOT DETECTED Final   Coronavirus 229E NOT DETECTED NOT DETECTED Final    Comment: (NOTE) The Coronavirus on the Respiratory Panel, DOES NOT test for the novel  Coronavirus (2019 nCoV)    Coronavirus HKU1 NOT DETECTED NOT DETECTED Final   Coronavirus NL63 NOT DETECTED NOT DETECTED Final   Coronavirus OC43 NOT DETECTED NOT DETECTED Final   Metapneumovirus NOT DETECTED NOT DETECTED Final   Rhinovirus / Enterovirus NOT DETECTED NOT DETECTED Final   Influenza A NOT DETECTED NOT DETECTED Final   Influenza B NOT DETECTED NOT DETECTED Final   Parainfluenza Virus 1  NOT DETECTED NOT DETECTED Final   Parainfluenza Virus 2 NOT DETECTED NOT DETECTED Final   Parainfluenza Virus 3 NOT DETECTED NOT DETECTED Final   Parainfluenza Virus 4 NOT DETECTED NOT DETECTED Final   Respiratory Syncytial Virus NOT DETECTED NOT DETECTED Final   Bordetella pertussis NOT DETECTED NOT DETECTED Final   Bordetella Parapertussis NOT DETECTED NOT DETECTED Final   Chlamydophila pneumoniae NOT DETECTED NOT DETECTED Final   Mycoplasma pneumoniae NOT DETECTED NOT DETECTED Final    Comment: Performed at [facility], [address] [place], [place] [address]  MRSA Next Gen by PCR, Nasal     Status: None   Collection Time: 02/05/22 10:11 AM   Specimen: Nasal Mucosa; Nasal Swab  Result Value Ref Range Status   MRSA by PCR Next Gen NOT DETECTED NOT DETECTED Final    Comment: (NOTE) The GeneXpert MRSA Assay (FDA approved for NASAL specimens only), is one component of a comprehensive MRSA colonization surveillance program. It is not intended to diagnose MRSA infection nor to guide or  monitor treatment for MRSA infections. Test performance is not FDA approved in patients less than 44 years old. Performed at [facility], [address] [place], [place] [address]     Imaging reviewed:  CT ANGIO HEAD NECK W WO CM Addendum Date: 03/18/2024 ** ADDENDUM #1 ** ADDENDUM: Finding of left M3 branch occlusion discussed with Lavanda Lesches, PA at 5:02PM on 03/18/24. ---------------------------------------------------- Electronically signed by: Donnice Mania MD 03/18/2024 08:02 PM EDT RP Workstation: [id number]   Result Date: 03/18/2024 ** ORIGINAL REPORT ** EXAM: CTA HEAD AND NECK WITH AND WITHOUT 03/18/2024 04:39:20 PM TECHNIQUE: CTA of the head and neck was performed with and without the administration of 50 mL iohexol (OMNIPAQUE) 350 MG/ML injection. Multiplanar 2D and/or 3D reformatted images are provided for review. Automated exposure control, iterative reconstruction, and/or weight based adjustment of the mA/kV was utilized to reduce the radiation dose to as low as reasonably achievable. Stenosis of the internal carotid arteries measured using NASCET criteria. COMPARISON: None available CLINICAL HISTORY: Neuro deficit, acute, stroke suspected. Neuro deficit, aphasic. FINDINGS: CTA NECK: AORTIC ARCH AND ARCH VESSELS: Atherosclerosis of the visualized aortic arch. 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 Atherosclerosis at the origin of the left subclavian artery without stenosis. No dissection or arterial injury. CERVICAL CAROTID ARTERIES: The right carotid artery is patent from the origin to the skull base. Atherosclerosis at the right carotid bifurcation without hemodynamically significant stenosis. Mild tortuosity of the right distal cervical ICA. The left carotid artery is patent from the origin to the skull base. Atherosclerosis at the left carotid bifurcation without hemodynamically significant stenosis. No dissection or arterial  injury. CERVICAL VERTEBRAL ARTERIES: The vertebral arteries are patent from the origins to the vertebrobasilar confluence. The left vertebral artery is dominant. Atherosclerosis of the left V4 segment without stenosis. No dissection or arterial injury. LUNGS AND MEDIASTINUM: Unremarkable. SOFT TISSUES: No acute abnormality. BONES: Changes throughout the visualized spine. No acute abnormality. CTA HEAD: ANTERIOR CIRCULATION: The intracranial internal carotid arteries are patent bilaterally. There is mild atherosclerosis of the carotid siphons. There is mild stenosis of the right paraclinoid right ICA. The right MCA is patent. The left M1 and proximal M2 segments are patent. There is occlusion of a small proximal M3 branch of the left MCA, age indeterminate. (603 image 153/265) The anterior cerebral arteries are patent bilaterally. Azygos configuration of the anterior cerebral arteries. No aneurysm. POSTERIOR  CIRCULATION: No significant stenosis of the posterior cerebral arteries. No significant stenosis of the basilar artery. No significant stenosis of the vertebral arteries. No aneurysm. OTHER: Mild chronic microvascular ischemic changes. Bilateral lens replacement. No dural venous sinus thrombosis on this non-dedicated study. IMPRESSION: 1. Occlusion of a small proximal M3 branch of the left MCA. 2. Mild stenosis of the right paraclinoid ICA. 3. 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 4. Mild chronic microvascular ischemic changes. Electronically signed by: Donnice Mania MD 03/18/2024 04:58 PM EDT RP Workstation: [id number]    EKG:  Personally reviewed, Afib, rate controlled at 90, with PVC or abberant beats, incomplete RBBB, no overt ischemic changes.   ED Course:  EDP consulted with neurology Dr. Matthews. Was out of window for TNK. Found to have L M3 occlusion, but also out of window for thrombectomy. Transferred to [facility] for further care.     Assessment/Plan:  68 y.o. male with hx longstanding persistent Afib on AC, nonobstructive CAD, DM2, HTN, HLD, who is transferred from [facility] ED for suspected stroke with L M3 occlusion in setting of his Afib and nonadherence with AC.   Clinical stroke, L MCA  L M3 occlusion, in setting of Afib / nonadherence with AC   Presented with global aphasia, but has improved comprehension since onset. LKWT 10/22, sometime after 5PM, reports later in evening but symptom onset before sleep. Exam with ongoing expressive aphasia. CTH / CTA H/N with no ICH, occlusion of small proximal Left M3 branch. Etiology cardioembolic in setting of afib.  -- EDP consulted with neurology Dr. Matthews. Was out of window for TNK. Found to have L M3 occlusion, but also out of window for thrombectomy.  -- Neurology consulted, notified Dr. Michaela of arrival to [facility], appreciate recommendations - MRI brain without contrast  -- preliminary recommendation to start on DAPT for now, and hold off on AC. was loaded with aspirin 325 mg x 1 at outside ED, continue 81 mg daily, load with Plavix 300 mg x1 and continue 75 mg daily  - Anticoagulant per neuro hold off for now, anticipate he would benefit from being back on Eliquis  when appropriate from neuro standpoint  - Statin continue home atorvastatin  40 mg   - Permissive HTN <220/120 for now, holding antiHTN  - Check lipids and A1c - Serial neurochecks  - Telemonitoring - TTE with bubble - PT/OT/SLP - DVT prophylaxis per below - Swallow screen then [facility] diet   Chronic medical problems:  Longstanding persistent Afib: Last fill of Eliquis  in 1/'25 for a 90 day supply, nonadherent at time of his admission. + complicated by cardioembolic stroke per above. Continue Carvedilol  for rate control. AC plan per above  Nonobstructive CAD: See antiPLT/statin per stroke above  DM 2: Home regimen is Lantus 22 units in the AM, Glipizide 5 mg in the AM, Metformin. Inpatient Continue basal with  modest reduction 18 units daily, and add SSI for very sensitive.  HTN: Hold home Amlodipine , Doxazosin, Furosemide , Losartan  for now for permissive HTN  HLD: See stroke above  ? Neuropathy: Continue home Gabapentin .   There is no height or weight on file to calculate BMI.    DVT prophylaxis:  SCDs Code Status:  DNR/DNI(Do NOT Intubate); he is clear at least for now would like to default to DNR/DNI, will think about this more and let us  know if he changes his mind.  Diet:  Diet Orders (From admission, onward)     Start  Ordered   03/18/24 1424  Diet NPO time specified  (Not Code Stroke (No thrombolytic / No IR, Stroke suspected.))  Diet effective now       Comments: NPO until swallow screen is complete   03/18/24 1423           Family Communication:  None   Consults:  Neurology   Admission status:   Inpatient, Telemetry bed  Severity of Illness: The appropriate patient status for this patient is INPATIENT. Inpatient status is judged to be reasonable and necessary in order to provide the required intensity of service to ensure the patient's safety. The patient's presenting symptoms, physical exam findings, and initial radiographic and laboratory data in the context of their chronic comorbidities is felt to place them at high risk for further clinical deterioration. Furthermore, it is not anticipated that the patient will be medically stable for discharge from the hospital within 2 midnights of admission.   * I certify that at the point of admission it is my clinical judgment that the patient will require inpatient hospital care spanning beyond 2 midnights from the point of admission due to high intensity of service, high risk for further deterioration and high frequency of surveillance required.*   Dorn Dawson, MD [facility]  How to contact the [facility] Attending or Consulting provider 7A - 7P or covering provider during after hours 7P -7A, for this patient.  Check the  care team in [facility] and look for a) attending/consulting [facility] provider listed and b) the [facility] team listed Log into [email or web address] and use Hel[facility]'s universal password to access. If you do not have the password, please contact the hospital operator. Locate the [facility] provider you are looking for under [facility] and page to a number that you can be directly reached. If you still have difficulty reaching the provider, please page the [facility] (Director on Call) for the Hospitalists listed on amion for assistance.  03/18/2024, 9:44 PM

## 2024-03-18 NOTE — ED Notes (Signed)
 Reconciled patient's meds. He's conversing with his sister with occasional word selection difficulty.

## 2024-03-19 ENCOUNTER — Inpatient Hospital Stay (HOSPITAL_COMMUNITY)

## 2024-03-19 ENCOUNTER — Other Ambulatory Visit (HOSPITAL_COMMUNITY): Payer: Self-pay

## 2024-03-19 ENCOUNTER — Telehealth (HOSPITAL_COMMUNITY): Payer: Self-pay | Admitting: Pharmacy Technician

## 2024-03-19 DIAGNOSIS — E119 Type 2 diabetes mellitus without complications: Secondary | ICD-10-CM

## 2024-03-19 DIAGNOSIS — I6389 Other cerebral infarction: Secondary | ICD-10-CM

## 2024-03-19 DIAGNOSIS — I10 Essential (primary) hypertension: Secondary | ICD-10-CM | POA: Insufficient documentation

## 2024-03-19 DIAGNOSIS — T45516A Underdosing of anticoagulants, initial encounter: Secondary | ICD-10-CM

## 2024-03-19 DIAGNOSIS — R29703 NIHSS score 3: Secondary | ICD-10-CM

## 2024-03-19 DIAGNOSIS — I639 Cerebral infarction, unspecified: Secondary | ICD-10-CM | POA: Diagnosis not present

## 2024-03-19 DIAGNOSIS — Z91128 Patient's intentional underdosing of medication regimen for other reason: Secondary | ICD-10-CM

## 2024-03-19 DIAGNOSIS — I4821 Permanent atrial fibrillation: Secondary | ICD-10-CM | POA: Insufficient documentation

## 2024-03-19 DIAGNOSIS — I4891 Unspecified atrial fibrillation: Secondary | ICD-10-CM

## 2024-03-19 DIAGNOSIS — I63412 Cerebral infarction due to embolism of left middle cerebral artery: Principal | ICD-10-CM

## 2024-03-19 DIAGNOSIS — E785 Hyperlipidemia, unspecified: Secondary | ICD-10-CM | POA: Insufficient documentation

## 2024-03-19 LAB — ECHOCARDIOGRAM COMPLETE
AR max vel: 3.11 cm2
AV Peak grad: 4.2 mmHg
Ao pk vel: 1.03 m/s
Area-P 1/2: 3.65 cm2
Height: 72 in
S' Lateral: 3.5 cm
Weight: 3200 [oz_av]

## 2024-03-19 LAB — HEMOGLOBIN A1C
Hgb A1c MFr Bld: 6.6 % — ABNORMAL HIGH (ref 4.8–5.6)
Mean Plasma Glucose: 142.72 mg/dL

## 2024-03-19 LAB — LIPID PANEL
Cholesterol: 156 mg/dL (ref 0–200)
HDL: 39 mg/dL — ABNORMAL LOW (ref >40)
LDL Cholesterol: 93 mg/dL (ref 0–99)
Total CHOL/HDL Ratio: 4 ratio
Triglycerides: 119 mg/dL (ref <150)
VLDL: 24 mg/dL (ref 0–40)

## 2024-03-19 LAB — BASIC METABOLIC PANEL WITH GFR
Anion gap: 12 (ref 5–15)
BUN: 9 mg/dL (ref 8–23)
CO2: 27 mmol/L (ref 22–32)
Calcium: 9 mg/dL (ref 8.9–10.3)
Chloride: 99 mmol/L (ref 98–111)
Creatinine, Ser: 1.07 mg/dL (ref 0.61–1.24)
GFR, Estimated: >60 mL/min (ref >60)
Glucose, Bld: 211 mg/dL — ABNORMAL HIGH (ref 70–99)
Potassium: 3.6 mmol/L (ref 3.5–5.1)
Sodium: 138 mmol/L (ref 135–145)

## 2024-03-19 LAB — MAGNESIUM: Magnesium: 1.5 mg/dL — ABNORMAL LOW (ref 1.7–2.4)

## 2024-03-19 LAB — CBC
HCT: 42.2 % (ref 39.0–52.0)
Hemoglobin: 14.2 g/dL (ref 13.0–17.0)
MCH: 31 pg (ref 26.0–34.0)
MCHC: 33.6 g/dL (ref 30.0–36.0)
MCV: 92.1 fL (ref 80.0–100.0)
Platelets: 257 K/uL (ref 150–400)
RBC: 4.58 MIL/uL (ref 4.22–5.81)
RDW: 12.6 % (ref 11.5–15.5)
WBC: 8.7 K/uL (ref 4.0–10.5)
nRBC: 0 % (ref 0.0–0.2)

## 2024-03-19 LAB — PROTIME-INR
INR: 1 (ref 0.8–1.2)
Prothrombin Time: 14 s (ref 11.4–15.2)

## 2024-03-19 LAB — PHOSPHORUS: Phosphorus: 3 mg/dL (ref 2.5–4.6)

## 2024-03-19 LAB — HIV ANTIBODY (ROUTINE TESTING W REFLEX): HIV Screen 4th Generation wRfx: NONREACTIVE

## 2024-03-19 LAB — GLUCOSE, CAPILLARY
Glucose-Capillary: 141 mg/dL — ABNORMAL HIGH (ref 70–99)
Glucose-Capillary: 184 mg/dL — ABNORMAL HIGH (ref 70–99)
Glucose-Capillary: 200 mg/dL — ABNORMAL HIGH (ref 70–99)

## 2024-03-19 MED ORDER — ACETAMINOPHEN 500 MG PO TABS
1000.0000 mg | ORAL_TABLET | Freq: Four times a day (QID) | ORAL | Status: DC | PRN
Start: 2024-03-19 — End: 2024-03-19
  Administered 2024-03-19 (×2): 1000 mg via ORAL
  Filled 2024-03-19 (×2): qty 2

## 2024-03-19 MED ORDER — MELATONIN 3 MG PO TABS: 6.0000 mg | ORAL_TABLET | Freq: Every evening | ORAL | Status: DC | PRN

## 2024-03-19 MED ORDER — APIXABAN 5 MG PO TABS
5.0000 mg | ORAL_TABLET | Freq: Two times a day (BID) | ORAL | Status: DC
  Administered 2024-03-19: 5 mg via ORAL
  Filled 2024-03-19: qty 1

## 2024-03-19 MED ORDER — ELIQUIS 5 MG PO TABS
5.0000 mg | ORAL_TABLET | Freq: Two times a day (BID) | ORAL | 0 refills | Status: DC
  Filled 2024-03-19: qty 180, 90d supply, fill #0

## 2024-03-19 MED ORDER — POLYETHYLENE GLYCOL 3350 17 G PO PACK: 17.0000 g | PACK | Freq: Every day | ORAL | Status: DC | PRN

## 2024-03-19 MED ORDER — ASPIRIN 81 MG PO TBEC
81.0000 mg | DELAYED_RELEASE_TABLET | Freq: Every day | ORAL | Status: DC
  Administered 2024-03-19: 81 mg via ORAL
  Filled 2024-03-19: qty 1

## 2024-03-19 MED ORDER — ATORVASTATIN CALCIUM 80 MG PO TABS: 80.0000 mg | ORAL_TABLET | Freq: Every day | ORAL | Status: DC

## 2024-03-19 MED ORDER — ONDANSETRON HCL 4 MG/2ML IJ SOLN: 4.0000 mg | Freq: Four times a day (QID) | INTRAMUSCULAR | Status: DC | PRN

## 2024-03-19 MED ORDER — MAGNESIUM SULFATE 2 GM/50ML IV SOLN
2.0000 g | Freq: Once | INTRAVENOUS | Status: AC
  Administered 2024-03-19: 2 g via INTRAVENOUS
  Filled 2024-03-19: qty 50

## 2024-03-19 MED ORDER — ALBUTEROL SULFATE (2.5 MG/3ML) 0.083% IN NEBU: 2.5000 mg | INHALATION_SOLUTION | RESPIRATORY_TRACT | Status: DC | PRN

## 2024-03-19 NOTE — Progress Notes (Signed)
 PHARMACY - ANTICOAGULATION CONSULT NOTE  Pharmacy Consult for apixaban  Indication: atrial fibrillation  No Known Allergies  Patient Measurements: Height: 6' (182.9 cm) IBW/kg (Calculated) : 77.6  Vital Signs: Temp: 97.4 F (36.3 C) (10/24 1218) Temp Source: Oral (10/24 1218) BP: 138/91 (10/24 1218) Pulse Rate: 83 (10/24 1218)  Labs: Recent Labs    03/18/24 1424 03/19/24 0124  HGB 14.8 14.2  HCT 43.9 42.2  PLT 261 257  APTT 28  --   LABPROT 13.3 14.0  INR 1.0 1.0  CREATININE 1.24 1.07    CrCl cannot be calculated (Unknown ideal weight.).   Medical History: Past Medical History:  Diagnosis Date   Atrial fibrillation (HCC)    Diabetes mellitus without complication (HCC)    Hypertension     Medications:  Medications Prior to Admission  Medication Sig Dispense Refill Last Dose/Taking   amLODipine  (NORVASC ) 2.5 MG tablet Take 2.5 mg by mouth daily.      aspirin EC 81 MG tablet Take 81 mg by mouth daily. Swallow whole.      atorvastatin  (LIPITOR) 40 MG tablet Take 40 mg by mouth daily.      carvedilol  (COREG ) 12.5 MG tablet Take 12.5 mg by mouth 2 (two) times daily.      cyclobenzaprine (FLEXERIL) 10 MG tablet Take 5-10 mg by mouth 3 (three) times daily as needed for muscle spasms.      diclofenac (VOLTAREN) 75 MG EC tablet Take 75 mg by mouth 2 (two) times daily.      doxazosin (CARDURA) 2 MG tablet Take 2 mg by mouth daily.      ELIQUIS  5 MG TABS tablet Take 5 mg by mouth 2 (two) times daily.      furosemide  (LASIX ) 20 MG tablet Take 20 mg by mouth daily.      gabapentin  (NEURONTIN ) 300 MG capsule Take 600 mg by mouth 2 (two) times daily. Take 1 to 2 tablets by mouth at bedtime.      gabapentin  (NEURONTIN ) 600 MG tablet Take 600-1,200 mg by mouth at bedtime.      glipiZIDE (GLUCOTROL XL) 10 MG 24 hr tablet Take 5 mg by mouth daily with breakfast.      LANTUS SOLOSTAR 100 UNIT/ML Solostar Pen Inject 40 Units into the skin daily.      losartan  (COZAAR ) 100 MG  tablet Take 100 mg by mouth daily.      metFORMIN (GLUCOPHAGE-XR) 500 MG 24 hr tablet Take 500 mg by mouth daily.      predniSONE (DELTASONE) 20 MG tablet Take 20 mg by mouth daily as needed.      Scheduled:   apixaban   5 mg Oral BID   [START ON 03/20/2024] atorvastatin   80 mg Oral Daily   carvedilol   12.5 mg Oral BID   gabapentin   600 mg Oral BID   insulin  aspart  0-6 Units Subcutaneous TID WC   sodium chloride  flush  3 mL Intravenous Q12H    Assessment: Pt with a hx of AF but stop apix this year. Now with CVA. Asa/plavix transition to apixaban  today per neuro.    Goal of Therapy:  Monitor platelets by anticoagulation protocol: Yes   Plan:  Dc asa/plavix Apixaban  5mg  PO BID  Sergio Batch, PharmD, BCIDP, AAHIVP, CPP Infectious Disease Pharmacist 03/19/2024 12:25 PM

## 2024-03-19 NOTE — TOC Initial Note (Signed)
 Transition of Care [facility]) - Initial/Assessment Note    Patient Details  Name: Peter Robinson MRN: [id number] Date of Birth: [birth year]  Transition of Care [facility]) CM/SW Contact:    Rosalva Jon Bloch, RN Phone Number: 03/19/2024, 11:23 AM  Clinical Narrative:                 Presented with aphasia  acute left posterior MCA territory infarcts per MRI. From home alone. PTA independent with ADL's, no DME usage.  Pt without RX med concerns or transportation issues. Pt agreeable to outpatient SLP therapy per therapy recommendations.Referral made with [facility] and noted on AVS.  IP CP team following and will continue assisting with needs...  Expected Discharge Plan: Home/Self Care Barriers to Discharge: Continued Medical Work up   Patient Goals and CMS Choice            Expected Discharge Plan and Services   Discharge Planning Services: CM Consult   Living arrangements for the past 2 months: Single Family Home                                      Prior Living Arrangements/Services Living arrangements for the past 2 months: Single Family Home Lives with:: Significant Other Patient language and need for interpreter reviewed:: Yes Do you feel safe going back to the place where you live?: Yes      Need for Family Participation in Patient Care: Yes (Comment) Care giver support system in place?: Yes (comment)   Criminal Activity/Legal Involvement Pertinent to Current Situation/Hospitalization: No - Comment as needed  Activities of Daily Living   ADL Screening (condition at time of admission) Independently performs ADLs?: Yes (appropriate for developmental age) Is the patient deaf or have difficulty hearing?: No Does the patient have difficulty seeing, even when wearing glasses/contacts?: No Does the patient have difficulty concentrating, remembering, or making decisions?: No  Permission Sought/Granted                   Emotional Assessment       Orientation: : Oriented to Self, Oriented to Place, Oriented to  Time, Oriented to Situation Alcohol / Substance Use: Not Applicable Psych Involvement: No (comment)  Admission diagnosis:  TIA (transient ischemic attack) [G45.9] Cerebrovascular accident (CVA) due to embolism of left middle cerebral artery (HCC) [I63.412] Acute ischemic stroke [facility]) [I63.9] Patient Active Problem List   Diagnosis Date Noted   TIA (transient ischemic attack) 03/18/2024   Acute ischemic stroke (HCC) 03/18/2024   Multifocal pneumonia 02/05/2022   PCP:  Marsa Miquel Faden, MD Pharmacy:   [facility] [phone] [place], [place] - [address] [place] [address] Phone: [phone] Fax: [phone]     Social Drivers of Health (SDOH) Social History: SDOH Screenings   Food Insecurity: No Food Insecurity (03/18/2024)  Housing: Low Risk  (03/18/2024)  Transportation Needs: No Transportation Needs (03/18/2024)  Utilities: Not At Risk (03/18/2024)  Social Connections: Unknown (03/18/2024)  Stress: No Stress Concern Present (08/08/2021)   Received from [facility]  Tobacco Use: Medium Risk (03/18/2024)   SDOH Interventions:     Readmission Risk Interventions     No data to display

## 2024-03-19 NOTE — Progress Notes (Signed)
 Echocardiogram 2D Echocardiogram has been performed.  Peter Robinson 03/19/2024, 4:41 PM

## 2024-03-19 NOTE — Telephone Encounter (Signed)
 Patient Product/process development scientist completed.    The patient is insured through [facility]. Patient has Medicare and is not eligible for a copay card, but may be able to apply for patient assistance or Medicare RX Payment Plan (Patient Must reach out to their plan, if eligible for payment plan), if available.    Ran test claim for Eliquis  5 mg and the current 30 day co-pay is $45.00.   This test claim was processed through [facility]- copay amounts may vary at other pharmacies due to pharmacy/plan contracts, or as the patient moves through the different stages of their insurance plan.     Reyes Sharps, CPHT Pharmacy Technician Patient Advocate Specialist Lead [facility] Patient Advocate Team Direct Number: [phone]  Fax: [phone]

## 2024-03-19 NOTE — Discharge Instructions (Signed)
 Peter Robinson,  You were in the hospital with a stroke. This is possibly related to your underlying atrial fibrillation. You have been recommended to resume taking Eliquis ; you can stop your aspirin. Please follow-up with your PCP and the neurologist.

## 2024-03-19 NOTE — Progress Notes (Signed)
 Reviewed AVS with patient and his wife. Stroke education. Answered all questions.

## 2024-03-19 NOTE — Progress Notes (Incomplete)
 PROGRESS NOTE    Peter Robinson  FMW:[id number] DOB: Apr 16, 1956 DOA: 03/18/2024 PCP: Marsa Miquel Faden, MD   Brief Narrative: No notes on file   Assessment and Plan: No notes have been filed under this hospital service. Service: Hospitalist          DVT prophylaxis: *** Code Status:   Code Status: Full Code Family Communication: *** Disposition Plan: ***   Consultants:  ***  Procedures:  ***  Antimicrobials: ***    Subjective: ***  Objective: BP (!) 138/91 (BP Location: Left Arm)   Pulse 83   Temp (!) 97.4 F (36.3 C) (Oral)   Resp 18   Ht 6' (1.829 m)   Wt 90.7 kg Comment: stated  SpO2 94%   BMI 27.12 kg/m   Examination:  General exam: Appears calm and comfortable. *** Respiratory system: Clear to auscultation. Respiratory effort normal. Cardiovascular system: S1 & S2 heard, RRR. *** murmur. Gastrointestinal system: Abdomen is nondistended, soft and nontender. Normal bowel sounds heard. Central nervous system: Alert and oriented. No focal neurological deficits. Musculoskeletal: No edema. No calf tenderness Skin: No cyanosis. No rashes Psychiatry: Judgement and insight appear normal. Mood & affect appropriate.    Data Reviewed: I have personally reviewed following labs and imaging studies  CBC Lab Results  Component Value Date   WBC 8.7 03/19/2024   RBC 4.58 03/19/2024   HGB 14.2 03/19/2024   HCT 42.2 03/19/2024   MCV 92.1 03/19/2024   MCH 31.0 03/19/2024   PLT 257 03/19/2024   MCHC 33.6 03/19/2024   RDW 12.6 03/19/2024   LYMPHSABS 0.9 03/18/2024   MONOABS 0.7 03/18/2024   EOSABS 0.1 03/18/2024   BASOSABS 0.1 03/18/2024     Last metabolic panel Lab Results  Component Value Date   NA 138 03/19/2024   K 3.6 03/19/2024   CL 99 03/19/2024   CO2 27 03/19/2024   BUN 9 03/19/2024   CREATININE 1.07 03/19/2024   GLUCOSE 211 (H) 03/19/2024   GFRNONAA >60 03/19/2024   CALCIUM  9.0 03/19/2024   PHOS 3.0 03/19/2024   PROT  6.8 03/18/2024   ALBUMIN 4.1 03/18/2024   BILITOT 0.7 03/18/2024   ALKPHOS 85 03/18/2024   AST 31 03/18/2024   ALT 23 03/18/2024   ANIONGAP 12 03/19/2024    GFR: Estimated Creatinine Clearance: 71.5 mL/min (by C-G formula based on SCr of 1.07 mg/dL).  No results found for this or any previous visit (from the past 240 hours).    Radiology Studies: MR BRAIN WO CONTRAST Result Date: 03/19/2024 EXAM: MRI BRAIN WITHOUT CONTRAST 03/19/2024 12:38:56 AM TECHNIQUE: Multiplanar multisequence MRI of the head/brain was performed without the administration of intravenous contrast. COMPARISON: None available. CLINICAL HISTORY: Stroke eval. FINDINGS: BRAIN AND VENTRICLES: Acute left posterior MCA territory infarcts involving the posterior left insula and overlying frontal and parietal lobes. Associated edema without mass effect or midline shift. No intracranial hemorrhage. No mass. No hydrocephalus. Normal flow voids. ORBITS: No acute abnormality. SINUSES AND MASTOIDS: No acute abnormality. BONES AND SOFT TISSUES: Normal marrow signal. No acute soft tissue abnormality. IMPRESSION: 1. Acute left posterior MCA territory infarcts. Electronically signed by: Gilmore Molt MD 03/19/2024 02:13 AM EDT RP Workstation: [id number]   CT ANGIO HEAD NECK W WO CM Addendum Date: 03/18/2024 ******** ADDENDUM #1 ******** ADDENDUM: Finding of left M3 branch occlusion discussed with Lavanda Lesches, PA at 5:02PM on 03/18/24. ---------------------------------------------------- Electronically signed by: Donnice Mania MD 03/18/2024 08:02 PM EDT RP Workstation: [id number]   Result Date: 03/18/2024 ********  ORIGINAL REPORT ******** EXAM: CTA HEAD AND NECK WITH AND WITHOUT 03/18/2024 04:39:20 PM TECHNIQUE: CTA of the head and neck was performed with and without the administration of 50 mL iohexol (OMNIPAQUE) 350 MG/ML injection. Multiplanar 2D and/or 3D reformatted images are provided for review. Automated exposure control,  iterative reconstruction, and/or weight based adjustment of the mA/kV was utilized to reduce the radiation dose to as low as reasonably achievable. Stenosis of the internal carotid arteries measured using NASCET criteria. COMPARISON: None available CLINICAL HISTORY: Neuro deficit, acute, stroke suspected. Neuro deficit, aphasic. FINDINGS: CTA NECK: AORTIC ARCH AND ARCH VESSELS: Atherosclerosis of the visualized aortic arch. 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 Atherosclerosis at the origin of the left subclavian artery without stenosis. No dissection or arterial injury. CERVICAL CAROTID ARTERIES: The right carotid artery is patent from the origin to the skull base. Atherosclerosis at the right carotid bifurcation without hemodynamically significant stenosis. Mild tortuosity of the right distal cervical ICA. The left carotid artery is patent from the origin to the skull base. Atherosclerosis at the left carotid bifurcation without hemodynamically significant stenosis. No dissection or arterial injury. CERVICAL VERTEBRAL ARTERIES: The vertebral arteries are patent from the origins to the vertebrobasilar confluence. The left vertebral artery is dominant. Atherosclerosis of the left V4 segment without stenosis. No dissection or arterial injury. LUNGS AND MEDIASTINUM: Unremarkable. SOFT TISSUES: No acute abnormality. BONES: Changes throughout the visualized spine. No acute abnormality. CTA HEAD: ANTERIOR CIRCULATION: The intracranial internal carotid arteries are patent bilaterally. There is mild atherosclerosis of the carotid siphons. There is mild stenosis of the right paraclinoid right ICA. The right MCA is patent. The left M1 and proximal M2 segments are patent. There is occlusion of a small proximal M3 branch of the left MCA, age indeterminate. (603 image 153/265) The anterior cerebral arteries are patent bilaterally. Azygos configuration of the anterior cerebral  arteries. No aneurysm. POSTERIOR CIRCULATION: No significant stenosis of the posterior cerebral arteries. No significant stenosis of the basilar artery. No significant stenosis of the vertebral arteries. No aneurysm. OTHER: Mild chronic microvascular ischemic changes. Bilateral lens replacement. No dural venous sinus thrombosis on this non-dedicated study. IMPRESSION: 1. Occlusion of a small proximal M3 branch of the left MCA. 2. Mild stenosis of the right paraclinoid ICA. 3. 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 4. Mild chronic microvascular ischemic changes. Electronically signed by: Donnice Mania MD 03/18/2024 04:58 PM EDT RP Workstation: [id number]      LOS: 1 day    Elgin Lam, MD [facility] 03/19/2024, 3:36 PM   If 7PM-7AM, please contact night-coverage [email or web address]

## 2024-03-19 NOTE — Hospital Course (Signed)
 Peter Robinson is a 68 y.o. male with a history of atrial fibrillation, CAD, diabetes mellitus type 2, hypertension, hyperlipidemia.  Patient presented secondary to speech and word finding difficulties, found to have an acute stroke. Neurology consulted. Etiology likely cardioembolic secondary to patient's underlying atrial fibrillation and patient medication nonadherence as he was not taking his Eliquis .

## 2024-03-19 NOTE — Discharge Summary (Signed)
 Physician Discharge Summary   Patient: Peter Robinson MRN: [id number] DOB: [birth year]  Admit date:     03/18/2024  Discharge date: 03/19/24  Discharge Physician: Elgin Lam, MD   PCP: Marsa Miquel Faden, MD   Recommendations at discharge:  PCP visit for hospital follow-up Neurology visit for hospital follow-up  Discharge Diagnoses: Principal Problem:   Acute ischemic stroke [facility]) Active Problems:   Permanent atrial fibrillation (HCC)   Primary hypertension   Hyperlipidemia   Controlled type 2 diabetes mellitus, with long-term current use of insulin  (HCC)  Resolved Problems:   * No resolved hospital problems. *  Hospital Course: Peter Robinson is a 68 y.o. male with a history of atrial fibrillation, CAD, diabetes mellitus type 2, hypertension, hyperlipidemia.  Patient presented secondary to speech and word finding difficulties, found to have an acute stroke. Neurology consulted. Etiology likely cardioembolic secondary to patient's underlying atrial fibrillation and patient medication nonadherence as he was not taking his Eliquis .  Assessment and Plan:  Acute stroke Etiology is secondary to atrial fibrillation. Patient presented with symptoms including speech/word finding difficulties. MRI brain (10/24) confirms acute left posterior MCA territory infarcts. 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 LDL of 93. Hemoglobin A1C of 6.6%. Transthoracic Echocardiogram significant for no atrial level shunt. Neurology recommendations for Eliquis  and to continue Lipitor. PT/OT recommendations for no follow-up. SLP recommendation for outpatient speech therapy. Recommendation for Neurology follow-up in 4 weeks.  Persistent atrial fibrillation Patient has been non-adherent with Eliquis . Counseled this admission in setting of acute stroke. Continue  Eliquis  on discharge.  Nonobstructive CAD Aspirin held secondary to initiation of Eliquis .  Diabetes mellitus type 2 Well controlled based on hemoglobin A1c of 6.6%. Continue home regimen.  Primary hypertension Continue home Cardura, Coreg , amlodipine , losartan   Hyperlipidemia Continue Lipitor.  Peripheral neuropathy Continue gabapentin .  Consultants: Neurology Procedures performed: Transthoracic Echocardiogram  Disposition: Home Diet recommendation: Cardiac and Carb modified diet   DISCHARGE MEDICATION: Allergies as of 03/19/2024   No Known Allergies      Medication List     STOP taking these medications    aspirin EC 81 MG tablet       TAKE these medications    amLODipine  2.5 MG tablet Commonly known as: NORVASC  Take 2.5 mg by mouth daily.   atorvastatin  40 MG tablet Commonly known as: LIPITOR Take 40 mg by mouth daily.   carvedilol  12.5 MG tablet Commonly known as: COREG  Take 12.5 mg by mouth 2 (two) times daily.   diclofenac 75 MG EC tablet Commonly known as: VOLTAREN Take 75 mg by mouth 2 (two) times daily as needed for mild pain (pain score 1-3) (playing golf).   doxazosin 2 MG tablet Commonly known as: CARDURA Take 2 mg by mouth daily.   Eliquis  5 MG Tabs tablet Generic drug: apixaban  Take 1 tablet (5 mg total) by mouth 2 (two) times daily.   furosemide  20 MG tablet Commonly known as: LASIX  Take 20 mg by mouth daily.   gabapentin  600 MG tablet Commonly known as: NEURONTIN  Take 1,200 mg by mouth at bedtime. What changed: Another medication with the same name was removed. Continue taking this medication, and follow the directions you see here.   glipiZIDE 5 MG 24 hr tablet Commonly known as: GLUCOTROL XL Take 5 mg by mouth daily with breakfast.   Lantus SoloStar 100 UNIT/ML  Solostar Pen Generic drug: insulin  glargine Inject 20-22 Units into the skin daily.   losartan  100 MG tablet Commonly known as: COZAAR  Take 100 mg by mouth  daily.   metFORMIN 500 MG 24 hr tablet Commonly known as: GLUCOPHAGE-XR Take 500 mg by mouth daily.   multivitamin with minerals Tabs tablet Take 1 tablet by mouth daily.   predniSONE 20 MG tablet Commonly known as: DELTASONE Take 20 mg by mouth daily as needed (for playing golf).        Follow-up Information     [facility] Follow up.   Specialty: Rehabilitation Why: Referral made for outpatient speech therapy, plaese call and arrange appointment time Contact information: [address] [place]  [address] [phone]        Marsa Miquel Faden, MD Follow up.   Specialty: Internal Medicine Contact information: [address] [place] [address] [phone]                Discharge Exam: BP (!) 138/91 (BP Location: Left Arm)   Pulse 83   Temp (!) 97.4 F (36.3 C) (Oral)   Resp 18   Ht 6' (1.829 m)   Wt 90.7 kg Comment: stated  SpO2 94%   BMI 27.12 kg/m   General exam: Appears calm and comfortable. Respiratory system: Clear to auscultation. Respiratory effort normal. Cardiovascular system: S1 & S2 heard, RRR.  Gastrointestinal system: Abdomen is nondistended, soft and nontender. Normal bowel sounds heard. Central nervous system: Alert and oriented. Word-finding difficulties. 5/5 upper/lower bilateral strength Musculoskeletal: No edema. No calf tenderness Skin: No cyanosis. No rashes Psychiatry: Judgement and insight appear normal. Mood & affect appropriate.   Condition at discharge: stable  The results of significant diagnostics from this hospitalization (including imaging, microbiology, ancillary and laboratory) are listed below for reference.   Imaging Studies:  Microbiology: Results for orders placed or performed during the hospital encounter of 02/05/22  SARS Coronavirus 2 by RT PCR (hospital order, performed in [facility] hospital lab) *cepheid single result test*  Anterior Nasal Swab     Status: None   Collection Time: 02/05/22  6:00 AM   Specimen: Anterior Nasal Swab  Result Value Ref Range Status   SARS Coronavirus 2 by RT PCR NEGATIVE NEGATIVE Final    Comment: (NOTE) SARS-CoV-2 target nucleic acids are NOT DETECTED.  The SARS-CoV-2 RNA is generally detectable in upper and lower respiratory specimens during the acute phase of infection. The lowest concentration of SARS-CoV-2 viral copies this assay can detect is 250 copies / mL. A negative result does not preclude SARS-CoV-2 infection and should not be used as the sole basis for treatment or other patient management decisions.  A negative result may occur with improper specimen collection / handling, submission of specimen other than nasopharyngeal swab, presence of viral mutation(s) within the areas targeted by this assay, and inadequate number of viral copies (<250 copies / mL). A negative result must be combined with clinical observations, patient history, and epidemiological information.  Fact Sheet for Patients:   RoadLapTop.co.za  Fact Sheet for Healthcare Providers: http://kim-miller.com/  This test is not yet approved or  cleared by the [place]  FDA and has been authorized for detection and/or diagnosis of SARS-CoV-2 by FDA under an Emergency Use Authorization (EUA).  This EUA will remain in effect (meaning this test can be used) for the duration of the COVID-19 declaration under Section 564(b)(1) of the Act, 21 U.S.C. section 360bbb-3(b)(1), unless the  authorization is terminated or revoked sooner.  Performed at [facility], [address] [place], [place] [address]   Respiratory (~20 pathogens) panel by PCR     Status: None   Collection Time: 02/05/22  7:06 AM   Specimen: Nasopharyngeal Swab; Respiratory  Result Value Ref Range Status   Adenovirus NOT DETECTED NOT DETECTED Final   Coronavirus 229E NOT DETECTED  NOT DETECTED Final    Comment: (NOTE) The Coronavirus on the Respiratory Panel, DOES NOT test for the novel  Coronavirus (2019 nCoV)    Coronavirus HKU1 NOT DETECTED NOT DETECTED Final   Coronavirus NL63 NOT DETECTED NOT DETECTED Final   Coronavirus OC43 NOT DETECTED NOT DETECTED Final   Metapneumovirus NOT DETECTED NOT DETECTED Final   Rhinovirus / Enterovirus NOT DETECTED NOT DETECTED Final   Influenza A NOT DETECTED NOT DETECTED Final   Influenza B NOT DETECTED NOT DETECTED Final   Parainfluenza Virus 1 NOT DETECTED NOT DETECTED Final   Parainfluenza Virus 2 NOT DETECTED NOT DETECTED Final   Parainfluenza Virus 3 NOT DETECTED NOT DETECTED Final   Parainfluenza Virus 4 NOT DETECTED NOT DETECTED Final   Respiratory Syncytial Virus NOT DETECTED NOT DETECTED Final   Bordetella pertussis NOT DETECTED NOT DETECTED Final   Bordetella Parapertussis NOT DETECTED NOT DETECTED Final   Chlamydophila pneumoniae NOT DETECTED NOT DETECTED Final   Mycoplasma pneumoniae NOT DETECTED NOT DETECTED Final    Comment: Performed at [facility], [address] [place], [place] [address]  MRSA Next Gen by PCR, Nasal     Status: None   Collection Time: 02/05/22 10:11 AM   Specimen: Nasal Mucosa; Nasal Swab  Result Value Ref Range Status   MRSA by PCR Next Gen NOT DETECTED NOT DETECTED Final    Comment: (NOTE) The GeneXpert MRSA Assay (FDA approved for NASAL specimens only), is one component of a comprehensive MRSA colonization surveillance program. It is not intended to diagnose MRSA infection nor to guide or monitor treatment for MRSA infections. Test performance is not FDA approved in patients less than 38 years old. Performed at [facility], [address], [place], [place] [address]     Labs: CBC: Recent Labs  Lab 03/18/24 1424 03/19/24 0124  WBC 10.2 8.7  NEUTROABS 8.4*  --   HGB 14.8 14.2  HCT 43.9 42.2  MCV 91.5 92.1  PLT 261 257   Basic Metabolic Panel: Recent Labs   Lab 03/18/24 1424 03/19/24 0124  NA 144 138  K 3.8 3.6  CL 106 99  CO2 26 27  GLUCOSE 176* 211*  BUN 16 9  CREATININE 1.24 1.07  CALCIUM  9.4 9.0  MG  --  1.5*  PHOS  --  3.0   Liver Function Tests: Recent Labs  Lab 03/18/24 1424  AST 31  ALT 23  ALKPHOS 85  BILITOT 0.7  PROT 6.8  ALBUMIN 4.1   CBG: Recent Labs  Lab 03/18/24 1418 03/19/24 0622 03/19/24 1221  GLUCAP 204* 141* 184*    Discharge time spent: 35 minutes.  Signed: Elgin Lam, MD [facility] 03/19/2024

## 2024-03-19 NOTE — Progress Notes (Signed)
 STROKE TEAM PROGRESS NOTE   BRIEF HPI [name] is a 68 y.o. male with history of OSA not on CPAP (intolerance to mask),  DM2, HTN, HLD, atrial fibrillation who self-discontinued Eliquis  presenting with speech changes and impaired reading comprehension starting 10/22.  Patient states that he first noticed that he was trying to read words on his laptop that he had typed that did not make sense with wrong spellings but states he went to bed feeling off.  When he woke, he noticed ongoing problems with reading and word-finding issues but did not notice speech disturbance as he lives alone and did not make any phone calls.  When his symptoms persisted for most of the day yesterday, he decided to drive himself to the hospital for further evaluation.   SIGNIFICANT HOSPITAL EVENTS 10/23:  - Presented with 1 day of speech disturbance/reading and word finding difficulty 10/24: - MRI brain with acute posterior left MCA territory infarcts CT angiogram showed left M3 occlusion INTERIM HISTORY/SUBJECTIVE Patient reports some improvement in speech disturbance but some ongoing deficits.  Patient's sister and friend are at bedside this morning.  No new deficits appreciated.  Exam is stable.  OBJECTIVE CBC    Component Value Date/Time   WBC 8.7 03/19/2024 0124   RBC 4.58 03/19/2024 0124   HGB 14.2 03/19/2024 0124   HCT 42.2 03/19/2024 0124   PLT 257 03/19/2024 0124   MCV 92.1 03/19/2024 0124   MCH 31.0 03/19/2024 0124   MCHC 33.6 03/19/2024 0124   RDW 12.6 03/19/2024 0124   LYMPHSABS 0.9 03/18/2024 1424   MONOABS 0.7 03/18/2024 1424   EOSABS 0.1 03/18/2024 1424   BASOSABS 0.1 03/18/2024 1424   BMET    Component Value Date/Time   NA 138 03/19/2024 0124   K 3.6 03/19/2024 0124   CL 99 03/19/2024 0124   CO2 27 03/19/2024 0124   GLUCOSE 211 (H) 03/19/2024 0124   BUN 9 03/19/2024 0124   CREATININE 1.07 03/19/2024 0124   CALCIUM  9.0 03/19/2024 0124   GFRNONAA >60 03/19/2024 0124   Lab  Results  Component Value Date   HGBA1C 6.6 (H) 03/19/2024   Lab Results  Component Value Date   CHOL 156 03/19/2024   HDL 39 (L) 03/19/2024   LDLCALC 93 03/19/2024   TRIG 119 03/19/2024   CHOLHDL 4.0 03/19/2024  Drugs of Abuse  No results found for: LABOPIA, COCAINSCRNUR, LABBENZ, AMPHETMU, THCU, LABBARB   IMAGING past 24 hours MR BRAIN WO CONTRAST Result Date: 03/19/2024 EXAM: MRI BRAIN WITHOUT CONTRAST 03/19/2024 12:38:56 AM TECHNIQUE: Multiplanar multisequence MRI of the head/brain was performed without the administration of intravenous contrast. COMPARISON: None available. CLINICAL HISTORY: Stroke eval. FINDINGS: BRAIN AND VENTRICLES: Acute left posterior MCA territory infarcts involving the posterior left insula and overlying frontal and parietal lobes. Associated edema without mass effect or midline shift. No intracranial hemorrhage. No mass. No hydrocephalus. Normal flow voids. ORBITS: No acute abnormality. SINUSES AND MASTOIDS: No acute abnormality. BONES AND SOFT TISSUES: Normal marrow signal. No acute soft tissue abnormality. IMPRESSION: 1. Acute left posterior MCA territory infarcts. Electronically signed by: Gilmore Molt MD 03/19/2024 02:13 AM EDT RP Workstation: [id number]   CT ANGIO HEAD NECK W WO CM Addendum Date: 03/18/2024  ADDENDUM #1  ADDENDUM: Finding of left M3 branch occlusion discussed with Lavanda Lesches, PA at 5:02PM on 03/18/24. ---------------------------------------------------- Electronically signed by: Donnice Mania MD 03/18/2024 08:02 PM EDT RP Workstation: [id number]   Result Date: 03/18/2024  ORIGINAL REPORT  EXAM: CTA HEAD  AND NECK WITH AND WITHOUT 03/18/2024 04:39:20 PM TECHNIQUE: CTA of the head and neck was performed with and without the administration of 50 mL iohexol (OMNIPAQUE) 350 MG/ML injection. Multiplanar 2D and/or 3D reformatted images are provided for review. Automated exposure control, iterative reconstruction, and/or weight based  adjustment of the mA/kV was utilized to reduce the radiation dose to as low as reasonably achievable. Stenosis of the internal carotid arteries measured using NASCET criteria. COMPARISON: None available CLINICAL HISTORY: Neuro deficit, acute, stroke suspected. Neuro deficit, aphasic. FINDINGS: CTA NECK: AORTIC ARCH AND ARCH VESSELS: Atherosclerosis of the visualized aortic arch. 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 Atherosclerosis at the origin of the left subclavian artery without stenosis. No dissection or arterial injury. CERVICAL CAROTID ARTERIES: The right carotid artery is patent from the origin to the skull base. Atherosclerosis at the right carotid bifurcation without hemodynamically significant stenosis. Mild tortuosity of the right distal cervical ICA. The left carotid artery is patent from the origin to the skull base. Atherosclerosis at the left carotid bifurcation without hemodynamically significant stenosis. No dissection or arterial injury. CERVICAL VERTEBRAL ARTERIES: The vertebral arteries are patent from the origins to the vertebrobasilar confluence. The left vertebral artery is dominant. Atherosclerosis of the left V4 segment without stenosis. No dissection or arterial injury. LUNGS AND MEDIASTINUM: Unremarkable. SOFT TISSUES: No acute abnormality. BONES: Changes throughout the visualized spine. No acute abnormality. CTA HEAD: ANTERIOR CIRCULATION: The intracranial internal carotid arteries are patent bilaterally. There is mild atherosclerosis of the carotid siphons. There is mild stenosis of the right paraclinoid right ICA. The right MCA is patent. The left M1 and proximal M2 segments are patent. There is occlusion of a small proximal M3 branch of the left MCA, age indeterminate. (603 image 153/265) The anterior cerebral arteries are patent bilaterally. Azygos configuration of the anterior cerebral arteries. No aneurysm. POSTERIOR CIRCULATION:  No significant stenosis of the posterior cerebral arteries. No significant stenosis of the basilar artery. No significant stenosis of the vertebral arteries. No aneurysm. OTHER: Mild chronic microvascular ischemic changes. Bilateral lens replacement. No dural venous sinus thrombosis on this non-dedicated study. IMPRESSION: 1. Occlusion of a small proximal M3 branch of the left MCA. 2. Mild stenosis of the right paraclinoid ICA. 3. 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 4. Mild chronic microvascular ischemic changes. Electronically signed by: Donnice Mania MD 03/18/2024 04:58 PM EDT RP Workstation: [id number]   Vitals:   03/18/24 1805 03/18/24 2105 03/18/24 2356 03/19/24 0358  BP: (!) 177/105 (!) 161/90 (!) 166/93 114/69  Pulse:  62 93 80  Resp:  19 17 16   Temp:  97.9 F (36.6 C) 97.6 F (36.4 C) 98.4 F (36.9 C)  TempSrc:  Oral Oral Oral  SpO2:  96% 96% 94%   PHYSICAL EXAM General:  Alert, well-nourished, well-developed patient in no acute distress Psych:  Mood and affect appropriate for situation, calm and cooperative with exam CV: Irregular rate and rhythm, atrial fibrillation Respiratory:  Regular, unlabored respirations on room air GI: Abdomen soft and nontender  NEURO:  Mental Status: AA&Ox3, patient is able to give clear and coherent history with some loss of fluency due to speech disturbance. 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  Cranial Nerves:  II: PERRL. Visual fields full.  III, IV, VI: EOMI. Eyelids elevate symmetrically.  V: Sensation is intact to light touch and symmetrical to face.  VII:  Face is symmetrical resting and with movement VIII: Hearing intact to voice. IX, X: Palate elevates symmetrically. Phonation is normal.  XI: Shoulder shrug 5/5. XII: Tongue protrudes midline Motor: 5/5 strength to all muscle groups tested.  Tone: is normal and bulk  is normal Sensation: Intact to light touch bilaterally. Extinction absent to light touch to DSS.   Coordination: FTN intact bilaterally, HKS: no ataxia in BLE.No drift.  Gait: Deferred  ASSESSMENT/PLAN Acute Ischemic Infarct:  left posterior MCA territory infarcts Etiology: Atrial fibrillation not on anticoagulation (patient self discontinued Eliquis ) CTA head & neck occlusion of small proximal M3 branch of the left MCA.  Mild stenosis of the right paraclinoid ICA.  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  Mild chronic microvascular ischemic changes. MRI acute left posterior MCA territory infarcts 2D Echo pending LDL 93 HgbA1c 6.6 VTE prophylaxis -on full dose anticoagulation No antithrombotic prior to admission, now on Eliquis  (apixaban ) daily  Therapy recommendations:  Outpatient ST Disposition: Likely to discharge once stroke work up is complete  Atrial fibrillation Home Meds: Eliquis , noncompliant at home PTA  Continue telemetry monitoring Begin anticoagulation with Eliquis ; patient verbalizes understanding of Eliquis  for secondary stroke prevention  Hypertension Home meds:  carvedilol , furosemide , amlodipine , losartan  Stable Blood pressure goal gradual normotension  Hyperlipidemia Home meds:  atorvastatin  40 mg LDL 93, goal < 70 Increase atorvastatin  to 80 mg  Continue statin at discharge  Diabetes type II Controlled Home meds: Metformin, Lantus, glipizide HgbA1c 6.6, goal < 7.0 CBGs SSI Recommend close follow-up with PCP for better DM control  Other Stroke Risk Factors Obstructive sleep apnea, not on CPAP at home Advised patient to follow up with established provider outpatient for CPAP adjustments for tolerance  Other Active Problems   Hospital day # 1  Mimi Ny, AGACNP-BC [facility] Pager: [phone] I have personally obtained history,examined this patient, reviewed notes,  independently viewed imaging studies, participated in medical decision making and plan of care.ROS completed by me personally and pertinent positives fully documented  I have made any additions or clarifications directly to the above note. Agree with note above.  Patient with known history of A-fib will discontinue anticoagulation presented with 2 to 3-day history of speech and word finding difficulties due to left M3 branch occlusion with atrial fibrillation not on anticoagulation.  Patient counseled to be compliant with medications and medical follow-up.  Resume Eliquis  and maintain aggressive risk factor modification.  Patient also counseled to be compliant with using CPAP for sleep apnea.  Long discussion with patient and his sister and friend at the bedside and answered questions   I personally spent a total of 50 minutes in the care of the patient today including getting/reviewing separately obtained history, performing a medically appropriate exam/evaluation, counseling and educating, placing orders, referring and communicating with other health care professionals, documenting clinical information in the EHR, independently interpreting results, and coordinating care.        Eather Popp, MD Medical Director [facility] Pager: [phone] 03/19/2024 3:16 PM  To contact Stroke Continuity provider, please refer to [email or web address]. After hours, contact General Neurology

## 2024-03-19 NOTE — Evaluation (Signed)
 Physical Therapy Evaluation & Discharge Patient Details Name: Peter Robinson MRN: [id number] DOB: [birth year] Today's Date: 03/19/2024  History of Present Illness  Pt is a 68 y.o. male who presented 03/18/24 with aphasia. MRI showed acute left posterior MCA territory infarcts, suspect in setting of his Afib and nonadherence with AC. PMH: longstanding persistent Afib on AC, nonobstructive CAD, DM2, HTN, HLD   Clinical Impression  Pt presents with condition above. PTA, he was living alone in a 2-level apartment with a level entry and his bedroom/bathroom upstairs. He has good support from family and friends at d/c. Other than some mild swaying with head turns/nods when ambulating, the pt appears to be functioning at or near his baseline, with pt confirming this. While supervision was provided for safety with his first time mobilizing with therapy, the pt is capable of mobilizing independently without AD or LOB. His primary deficit appears to be his speech as he continues to have difficulty finding the correct words intermittently. He could benefit from a cognitive assessment to ensure there are no cognitive deficits as well, but will defer that to SLP. He followed multi-step commands, dual tasked, and was able to path find his way back to his room without assistance this session. Pt displays symmetrical, intact, and WFL bil upper and lower extremity strength, sensation, and coordination. He denies any visual changes. Educated pt and friend on BE FAST. All education complete and questions answered. Pt's mild balance deficits will likely improve quickly with further mobility. No further skilled PT services needed at this time. Will sign off. Thank you for this referral.    If plan is discharge home, recommend the following:  (N/A)   Can travel by private vehicle        Equipment Recommendations None recommended by PT  Recommendations for Other Services       Functional Status Assessment Patient  has had a recent decline in their functional status and demonstrates the ability to make significant improvements in function in a reasonable and predictable amount of time.     Precautions / Restrictions Precautions Precautions: None Restrictions Weight Bearing Restrictions Per Provider Order: No      Mobility  Bed Mobility Overal bed mobility: Modified Independent             General bed mobility comments: HOB elevated, no assistance needed    Transfers Overall transfer level: Needs assistance Equipment used: None Transfers: Sit to/from Stand Sit to Stand: Independent, Supervision           General transfer comment: Pt quick to stand from EOB but no LOB or assistance needed, capable of being independent even though supervision provided for safety since this was the first time pt has worked with therapy    Ambulation/Gait Ambulation/Gait assistance: Independent, Chief Operating Officer (Feet): 400 Feet (x2 bouts of ~400 ft > ~30 ft) Assistive device: None Gait Pattern/deviations: Step-through pattern, Narrow base of support Gait velocity: WFL Gait velocity interpretation: >4.37 ft/sec, indicative of normal walking speed   General Gait Details: Pt ambulates with a narrow BOS, which he reports feels normal, yet he also reports feeling like he is drunk walking, unsure if due to anxiety or just first time up OOB. Pt does slow some and sway mildly with head turns and nods, but is able to weave in and out of obstacles and step over objects without LOB or assistance. Supervision provided but pt capable of being independent  Stairs Stairs: Yes Stairs assistance: Supervision, Independent Stair Management: No  rails, Alternating pattern, Forwards Number of Stairs: 6 General stair comments: Ascends and descends stairs without LOB, supervision for safety but capable of being independent  Wheelchair Mobility     Tilt Bed    Modified Rankin (Stroke Patients  Only) Modified Rankin (Stroke Patients Only) Pre-Morbid Rankin Score: No symptoms Modified Rankin: No significant disability     Balance Overall balance assessment: Mild deficits observed, not formally tested (very mild)                                           Pertinent Vitals/Pain Pain Assessment Pain Assessment: 0-10 Pain Score: 7  Pain Location: headache Pain Descriptors / Indicators: Headache, Discomfort Pain Intervention(s): Limited activity within patient's tolerance, Monitored during session, Repositioned    Home Living Family/patient expects to be discharged to:: Private residence Living Arrangements: Alone Available Help at Discharge: Family;Friend(s);Available 24 hours/day Type of Home: Apartment Home Access: Level entry     Alternate Level Stairs-Number of Steps: 12 Home Layout: Two level;Bed/bath upstairs;1/2 bath on main level;Able to live on main level with bedroom/bathroom Home Equipment: None      Prior Function Prior Level of Function : Independent/Modified Independent;Driving;Working/employed             Mobility Comments: No 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 Extremity Assessment: Overall WFL for tasks assessed;Right hand dominant (MMT scores fo 5/5 grossly bil; sensation and coordination intact bil)    Lower Extremity Assessment Lower Extremity Assessment: Overall WFL for tasks assessed (MMT scores fo 5/5 grossly bil; sensation and coordination intact bil)    Cervical / Trunk Assessment Cervical / Trunk Assessment: Normal  Communication   Communication Communication: Impaired Factors Affecting Communication: Difficulty expressing self    Cognition Arousal: Alert Behavior During Therapy: WFL for tasks assessed/performed, Anxious   PT - Cognitive impairments: No apparent impairments                       PT - Cognition Comments: A&Ox4.  Recalls where his room is easily with visual landmarks and does not need help for path finding. Can be quick to move, but suspect this is due to some anxiety in regards to situation and not so much impulsivity. Follows multi step commands. Likely at baseline, but could benefit from a formal cog assessment Following commands: Intact       Cueing Cueing Techniques: Verbal cues     General Comments General comments (skin integrity, edema, etc.): Educated pt and friend on BE FAST    Exercises     Assessment/Plan    PT Assessment Patient does not need any further PT services  PT Problem List         PT Treatment Interventions      PT Goals (Current goals can be found in the Care Plan section)  Acute Rehab PT Goals Patient Stated Goal: to recover fully PT Goal Formulation: All assessment and education complete, DC therapy Time For Goal Achievement: 03/20/24 Potential to Achieve Goals: Good    Frequency       Co-evaluation               AM-PAC PT 6 Clicks Mobility  Outcome Measure Help needed turning from your back to your side while in a flat bed without using bedrails?: None Help  needed moving from lying on your back to sitting on the side of a flat bed without using bedrails?: None Help needed moving to and from a bed to a chair (including a wheelchair)?: None Help needed standing up from a chair using your arms (e.g., wheelchair or bedside chair)?: None Help needed to walk in hospital room?: None Help needed climbing 3-5 steps with a railing? : None 6 Click Score: 24    End of Session   Activity Tolerance: Patient tolerated treatment well Patient left: with call bell/phone within reach;with family/visitor present;Other (comment) (standing in room with SLP) Nurse Communication: Mobility status PT Visit Diagnosis: Other symptoms and signs involving the nervous system (R29.898)    Time: 9192-9159 PT Time Calculation (min) (ACUTE ONLY): 33 min   Charges:    PT Evaluation $PT Eval Low Complexity: 1 Low PT Treatments $Therapeutic Activity: 8-22 mins PT General Charges $$ ACUTE PT VISIT: 1 Visit         Theo Ferretti, PT, DPT Acute Rehabilitation Services  Office: [phone]   Theo CHRISTELLA Ferretti 03/19/2024, 8:53 AM

## 2024-03-19 NOTE — Evaluation (Signed)
 Speech Language Pathology Evaluation Patient Details Name: Peter Robinson MRN: [id number] DOB: 1955/10/26 Today's Date: 03/19/2024 Time: 9159-9094 SLP Time Calculation (min) (ACUTE ONLY): 25 min  Problem List:  Patient Active Problem List   Diagnosis Date Noted   TIA (transient ischemic attack) 03/18/2024   Acute ischemic stroke (HCC) 03/18/2024   Multifocal pneumonia 02/05/2022   Past Medical History:  Past Medical History:  Diagnosis Date   Atrial fibrillation (HCC)    Diabetes mellitus without complication (HCC)    Hypertension    Past Surgical History:  Past Surgical History:  Procedure Laterality Date   CATARACT EXTRACTION Bilateral    HPI:  Peter Robinson is a 68 y.o. male with hx of longstanding persistent Afib on AC, nonobstructive CAD, DM2, HTN, HLD, who is transferred from [facility] ED for suspected stroke with L M3 occlusion in setting of his Afib and nonadherence with AC. MRI, 03/19/24,   1. Acute left posterior MCA territory infarcts.   Assessment / Plan / Recommendation Clinical Impression  Pt seen for cognitive-communication evaluation. 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 Pt with relatively intact auditory comprehension with pt benefiting for extra time, repetition, and self-correction. Pt's speech is non-fluent and is c/b wordfinding (phonemic paraphasia), sequencing errors,and repetition/revision. This is evident conversationally as well as during confrontation and divergent naming tasks. Pt with increased difficulty repeating phrases/sentence of increasing length. Cognitively, a full cognitive-linguistic evaluation was not completed at this time; however, pt A&Ox4, demonstrates good awareness of current communication difficulty, demonstrated good verbal problem solving ability, but had difficulty functionally (e.g. finding call bell). Introduced wordfinding strategies as well as general  strategies for improved communication this date. Pt would benefit from reinforcement. Recommend continued ST acute and post-acute for above mentioned deficits.    SLP Assessment  SLP Recommendation/Assessment: Patient needs continued Speech Language Pathology Services SLP Visit Diagnosis: Aphasia (R47.01);Apraxia (R48.2);Cognitive communication deficit (R41.841)     Assistance Recommended at Discharge  Frequent or constant Supervision/Assistance (initially)  Functional Status Assessment Patient has had a recent decline in their functional status and demonstrates the ability to make significant improvements in function in a reasonable and predictable amount of time.  Frequency and Duration min 2x/week  2 weeks      SLP Evaluation Cognition  Arousal/Alertness: Awake/alert Orientation Level: Oriented X4 Problem Solving: Impaired Problem Solving Impairment:  (verbal basic WFL; difficulty locating call bell)       Comprehension  Auditory Comprehension Overall Auditory Comprehension: Impaired Yes/No Questions: Within Functional Limits (with self-correction) Commands: Within Functional Limits (2-step with extra time) Other Conversation Comments: occasional repetition of stimuli EffectiveTechniques: Extra processing time;Repetition;Visual/Gestural cues Visual Recognition/Discrimination Discrimination: Not tested Reading Comprehension Reading Status: Not tested    Expression Expression Primary Mode of Expression: Verbal Verbal Expression Overall Verbal Expression: Impaired Initiation: No impairment Automatic Speech: Name;Social Response (WFL) Level of Generative/Spontaneous Verbalization: Phrase;Sentence Repetition: Impaired Level of Impairment: Phrase level;Sentence level Naming: Impairment Confrontation: Impaired (8/10 objects without cues) Divergent:  (8 animals in 60s) Verbal Errors: Phonemic paraphasias;Perseveration Pragmatics: Impairment Impairments: Topic maintenance  (due to aphasia) Written Expression Written Expression: Not tested   Oral / Motor  Oral Motor/Sensory Function Overall Oral Motor/Sensory Function: Within functional limits Motor Speech Overall Motor Speech: Appears within functional limits for tasks assessed           Peter Robinson, M.S., CCC-SLP Speech-Language Pathologist Secure Chat Preferred  O: [phone]  Peter Robinson 03/19/2024, 9:25 AM

## 2024-03-19 NOTE — Consult Note (Signed)
 NEUROLOGY CONSULT NOTE   Date of service: March 19, 2024 Patient Name: Peter Robinson MRN:  [id number] DOB:  07/02/1955 Chief Complaint: Speech difficulty Requesting Provider: Keturah Carrier, MD  History of Present Illness  Peter Robinson is a 68 y.o. male with hx of atrial fibrillation who self discontinued Eliquis , and started noticing speech changes on 10/22.  LKW: 10/22 Modified rankin score: 0-Completely asymptomatic and back to baseline post- stroke IV Thrombolysis: Outside of window EVT: Outside of window  NIHSS components Score: Comment  1a Level of Conscious 0[x]  1[]  2[]  3[]      1b LOC Questions 0[]  1[x]  2[]       1c LOC Commands 0[x]  1[]  2[]       2 Best Gaze 0[x]  1[]  2[]       3 Visual 0[x]  1[]  2[]  3[]      4 Facial Palsy 0[x]  1[]  2[]  3[]      5a Motor Arm - left 0[x]  1[]  2[]  3[]  4[]  UN[]    5b Motor Arm - Right 0[x]  1[]  2[]  3[]  4[]  UN[]    6a Motor Leg - Left 0[x]  1[]  2[]  3[]  4[]  UN[]    6b Motor Leg - Right 0[x]  1[]  2[]  3[]  4[]  UN[]    7 Limb Ataxia 0[x]  1[]  2[]  UN[]      8 Sensory 0[x]  1[]  2[]  UN[]      9 Best Language 0[]  1[x]  2[]  3[]      10 Dysarthria 0[x]  1[]  2[]  UN[]      11 Extinct. and Inattention 0[x]  1[]  2[]       TOTAL: 3      Past History   Past Medical History:  Diagnosis Date   Atrial fibrillation (HCC)    Diabetes mellitus without complication (HCC)    Hypertension     Past Surgical History:  Procedure Laterality Date   CATARACT EXTRACTION Bilateral     Family History: History reviewed. No pertinent family history.  Social History  reports that he has quit smoking. His smoking use included cigarettes. He has never used smokeless tobacco. He reports current alcohol use. He reports that he does not use drugs.  No Known Allergies  Medications   Current Facility-Administered Medications:     stroke: early stages of recovery book, , Does not apply, Once, Keturah Carrier, MD   acetaminophen  (TYLENOL ) tablet 1,000 mg, 1,000 mg, Oral, Q6H PRN,  Segars, Jonathan, MD, 1,000 mg at 03/19/24 0058   albuterol (PROVENTIL) (2.5 MG/3ML) 0.083% nebulizer solution 2.5 mg, 2.5 mg, Nebulization, Q4H PRN, Segars, Jonathan, MD   aspirin EC tablet 81 mg, 81 mg, Oral, Daily, Segars, Carrier, MD   atorvastatin  (LIPITOR) tablet 40 mg, 40 mg, Oral, Daily, Segars, Carrier, MD, 40 mg at 03/18/24 2348   carvedilol  (COREG ) tablet 12.5 mg, 12.5 mg, Oral, BID, Segars, Carrier, MD, 12.5 mg at 03/18/24 2348   [COMPLETED] clopidogrel (PLAVIX) tablet 300 mg, 300 mg, Oral, Once, 300 mg at 03/19/24 0058 **FOLLOWED BY** [START ON 03/20/2024] clopidogrel (PLAVIX) tablet 75 mg, 75 mg, Oral, Daily, Segars, Carrier, MD   gabapentin  (NEURONTIN ) capsule 600 mg, 600 mg, Oral, BID, Segars, Carrier, MD, 600 mg at 03/18/24 2348   insulin  aspart (novoLOG ) injection 0-6 Units, 0-6 Units, Subcutaneous, TID WC, Segars, Carrier, MD   melatonin tablet 6 mg, 6 mg, Oral, QHS PRN, Segars, Jonathan, MD   ondansetron  (ZOFRAN ) injection 4 mg, 4 mg, Intravenous, Q6H PRN, Segars, Carrier, MD   polyethylene glycol (MIRALAX  / GLYCOLAX ) packet 17 g, 17 g, Oral, Daily PRN, Segars, Jonathan, MD   sodium chloride  flush (NS) 0.9 %  injection 3 mL, 3 mL, Intravenous, Q12H, Segars, Jonathan, MD, 3 mL at 19-Mar-2024 2351  Vitals   Vitals:   03/19/2024 1805 03/19/24 2105 19-Mar-2024 2356 03/19/24 0358  BP: (!) 177/105 (!) 161/90 (!) 166/93 114/69  Pulse:  62 93 80  Resp:  19 17 16   Temp:  97.9 F (36.6 C) 97.6 F (36.4 C) 98.4 F (36.9 C)  TempSrc:  Oral Oral Oral  SpO2:  96% 96% 94%    There is no height or weight on file to calculate BMI.   Physical Exam   Constitutional: Appears well-developed and well-nourished.  Neurologic Examination    Neuro: Mental Status: Patient is awake, alert, oriented to person, but is unable to give the month or the year, but is able to give his age He has a significant expressive aphasia, with significant difficulty with word finding, as well as some  perseveration but he is able to still communicate and is able to understand readily Cranial Nerves: II: Visual Fields are full. Pupils are equal, round, and reactive to light.   III,IV, VI: EOMI without ptosis or diploplia.  V: Facial sensation is symmetric to temperature VII: Facial movement is symmetric.  VIII: hearing is intact to voice X: Uvula elevates symmetrically XII: tongue is midline without atrophy or fasciculations.  Motor: Tone is normal. Bulk is normal. 5/5 strength was present in all four extremities.  Sensory: Sensation is symmetric to light touch and temperature in the arms and legs. Cerebellar: FNF intact bilaterally        Labs/Imaging/Neurodiagnostic studies   CBC:  Recent Labs  Lab 03/19/24 1424 03/19/24 0124  WBC 10.2 8.7  NEUTROABS 8.4*  --   HGB 14.8 14.2  HCT 43.9 42.2  MCV 91.5 92.1  PLT 261 257   Basic Metabolic Panel:  Lab Results  Component Value Date   NA 138 03/19/2024   K 3.6 03/19/2024   CO2 27 03/19/2024   GLUCOSE 211 (H) 03/19/2024   BUN 9 03/19/2024   CREATININE 1.07 03/19/2024   CALCIUM  9.0 03/19/2024   GFRNONAA >60 03/19/2024   Lipid Panel:  Lab Results  Component Value Date   LDLCALC 93 03/19/2024   HgbA1c:  Lab Results  Component Value Date   HGBA1C 6.6 (H) 03/19/2024   Alcohol Level No results found for: [facility] INR  Lab Results  Component Value Date   INR 1.0 03/19/2024   APTT  Lab Results  Component Value Date   APTT 28 2024/03/19    MRI Brain(Personally reviewed): Left parietal stroke  CTA-distal MCA branch occlusion  ASSESSMENT   Peter Robinson is a 68 y.o. 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  Though A-fib is likely diagnosis, I would still favor controlling his diabetes and treating his lipids as well.  RECOMMENDATIONS  Aspirin 81 mg for now, likely will need Eliquis  over the long-term Atorvastatin  40 mg daily for LDL 93 DM control His  exam is stable at normotension, so no need for permissive hypertension, but would avoid hypotension Speech therapy Stroke team to follow ______________________________________________________________________    Signed, Aisha Seals, MD [facility]

## 2024-03-19 NOTE — Evaluation (Addendum)
 Occupational Therapy Evaluation Patient Details Name: Peter Robinson MRN: [id number] DOB: May 26, 1956 Today's Date: 03/19/2024   History of Present Illness   Pt is a 68 y.o. male who presented 03/18/24 with aphasia. MRI showed acute left posterior MCA territory infarcts, suspect in setting of his Afib and nonadherence with AC. PMH: longstanding persistent Afib on AC, nonobstructive CAD, DM2, HTN, HLD     Clinical Impressions Pt greeted in bed, agreeable for OT evaluation. Supportive family at bedside. PTA, pt was working full time as a Sport and exercise psychologist and was indep with ADLs, IADLs, driving. Lives alone but sister can provide support upon d/c if needed. Functionally, he was mod I for all UB/LB ADLs, functional transfers, and ambulation without AD. Visual scanning and divided attention task in hall to simulate grocery shopping without difficulty. Written task difficult 2/2 language deficits; provided compensatory techniques for list-making and word recall. He scored 2/28 on Short Blessed Test, indicating no cognitive impairment. Pt/family receptive to this.  Pt is functioning near his baseline and does not warrant any further acute nor post-acute OT needs. Will sign-off.     If plan is discharge home, recommend the following:   Assist for transportation     Functional Status Assessment         Equipment Recommendations   None recommended by OT     Recommendations for Other Services         Precautions/Restrictions   Precautions Precautions: None Restrictions Weight Bearing Restrictions Per Provider Order: No     Mobility Bed Mobility Overal bed mobility: Modified Independent                  Transfers Overall transfer level: Modified independent Equipment used: None                      Balance Overall balance assessment: Mild deficits observed, not formally tested                                         ADL either  performed or assessed with clinical judgement   ADL Overall ADL's : Modified independent                                       General ADL Comments: demo'd several UB/LB ADLs without difficulty this date     Vision Baseline Vision/History: 1 Wears glasses Ability to See in Adequate Light: 0 Adequate Patient Visual Report: No change from baseline Vision Assessment?: Wears glasses for reading;No apparent visual deficits     Perception         Praxis         Pertinent Vitals/Pain Pain Assessment Pain Assessment: No/denies pain Pain Score: 0-No pain     Extremity/Trunk Assessment Upper Extremity Assessment Upper Extremity Assessment: Overall WFL for tasks assessed   Lower Extremity Assessment Lower Extremity Assessment: Defer to PT evaluation   Cervical / Trunk Assessment Cervical / Trunk Assessment: Normal   Communication Communication Communication: Impaired Factors Affecting Communication: Difficulty expressing self   Cognition Arousal: Alert Behavior During Therapy: WFL for tasks assessed/performed Cognition: No apparent impairments             OT - Cognition Comments: mild deficits suspected 2/2 communication impairments  Following commands: Intact       Cueing  General Comments   Cueing Techniques: Verbal cues;Visual cues  supportive sister & friend present   Exercises     Shoulder Instructions      Home Living Family/patient expects to be discharged to:: Private residence Living Arrangements: Alone Available Help at Discharge: Family;Friend(s);Available 24 hours/day Type of Home: Apartment Home Access: Level entry     Home Layout: Two level;Bed/bath upstairs;1/2 bath on main level;Able to live on main level with bedroom/bathroom Alternate Level Stairs-Number of Steps: 12 Alternate Level Stairs-Rails: Left Bathroom Shower/Tub: Tub/shower unit   Bathroom Toilet: Standard     Home Equipment:  None          Prior Functioning/Environment Prior Level of Function : Independent/Modified Independent;Driving;Working/employed             Mobility Comments: no AD PTA ADLs Comments: indep; works as a Sport and exercise psychologist for a [employer] Problem List: Impaired balance (sitting and/or standing);Decreased cognition   OT Treatment/Interventions:        OT Goals(Current goals can be found in the care plan section)   Acute Rehab OT Goals Patient Stated Goal: get back to normal   OT Frequency:       Co-evaluation              AM-PAC OT 6 Clicks Daily Activity     Outcome Measure Help from another person eating meals?: None Help from another person taking care of personal grooming?: None Help from another person toileting, which includes using toliet, bedpan, or urinal?: None Help from another person bathing (including washing, rinsing, drying)?: None Help from another person to put on and taking off regular upper body clothing?: None Help from another person to put on and taking off regular lower body clothing?: None 6 Click Score: 24   End of Session Nurse Communication: Mobility status  Activity Tolerance: Patient tolerated treatment well Patient left: in bed;with call bell/phone within reach;with family/visitor present  OT Visit Diagnosis: Unsteadiness on feet (R26.81);Cognitive communication deficit (R41.841) Symptoms and signs involving cognitive functions: Cerebral infarction                Time: 8960-8887 OT Time Calculation (min): 33 min Charges:  OT General Charges $OT Visit: 1 Visit OT Evaluation $OT Eval Low Complexity: 1 Low OT Treatments $Self Care/Home Management : 8-22 mins  Tollie Canada D., MSOT, OTR/L Acute Rehabilitation Services [phone] Secure Chat Preferred  Rikki Milch 03/19/2024, 12:13 PM

## 2024-03-22 ENCOUNTER — Ambulatory Visit: Attending: Family Medicine | Admitting: Speech Pathology

## 2024-03-22 ENCOUNTER — Encounter: Payer: Self-pay | Admitting: Speech Pathology

## 2024-03-22 DIAGNOSIS — I63412 Cerebral infarction due to embolism of left middle cerebral artery: Secondary | ICD-10-CM | POA: Diagnosis not present

## 2024-03-22 DIAGNOSIS — R4701 Aphasia: Secondary | ICD-10-CM | POA: Insufficient documentation

## 2024-03-22 NOTE — Therapy (Unsigned)
 OUTPATIENT SPEECH LANGUAGE PATHOLOGY APHASIA EVALUATION   Patient Name: Peter Robinson MRN: [id number] DOB:01/12/1956, 68 y.o., male Today's Date: 03/22/2024  PCP: Marsa Miquel Faden, MD REFERRING PROVIDER: Briana Elgin LABOR, MD  END OF SESSION:  End of Session - 03/22/24 1237     Visit Number 1    Date for Recertification  05/22/24    SLP Start Time 1230    SLP Stop Time  1310    SLP Time Calculation (min) 40 min    Activity Tolerance Patient tolerated treatment well          Past Medical History:  Diagnosis Date   Atrial fibrillation (HCC)    Diabetes mellitus without complication (HCC)    Hypertension    Past Surgical History:  Procedure Laterality Date   CATARACT EXTRACTION Bilateral    Patient Active Problem List   Diagnosis Date Noted   Permanent atrial fibrillation (HCC) 03/19/2024   Primary hypertension 03/19/2024   Hyperlipidemia 03/19/2024   Controlled type 2 diabetes mellitus, with long-term current use of insulin  (HCC) 03/19/2024   Acute ischemic stroke (HCC) 03/18/2024   Multifocal pneumonia 02/05/2022    ONSET DATE: Referred on 03/19/24   REFERRING DIAG: P36.587 (ICD-10-CM) - Cerebrovascular accident (CVA) due to embolism of left middle cerebral artery (HCC)   THERAPY DIAG:  Aphasia  Rationale for Evaluation and Treatment: Rehabilitation  SUBJECTIVE:   SUBJECTIVE STATEMENT: Pt was pleasant and cooperative throughout evaluation.   Pt accompanied by: self  PERTINENT HISTORY: Per EMR: Jeramie Scogin is a 68 y.o. male with a history of atrial fibrillation, CAD, diabetes mellitus type 2, hypertension, hyperlipidemia.   PAIN:  Are you having pain? No  FALLS: Has patient fallen in last 6 months?  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  DIAGNOSTIC FINDINGS: Per EMR  MR BRAIN WO CONTRAST  IMPRESSION: 1. Acute left posterior MCA territory infarcts.   Electronically signed by: Gilmore Molt MD 03/19/2024  COGNITION: Overall cognitive status: Within functional limits for tasks assessed; reports no changes   AUDITORY COMPREHENSION: Overall auditory comprehension: Relatively intact; however, did take longer to process more complex sentence structure and required repetition.  YES/NO questions: Appears intact Following directions: 2-step 100% Conversation: Moderately Complex Interfering components:   Effective technique: extra processing time and repetition/stressing words  READING COMPREHENSION: Not tested; next session  EXPRESSION: verbal  VERBAL EXPRESSION: Level of generative/spontaneous verbalization: conversation Automatic speech: day of week: intact and month of year: intact  Repetition: Impaired: sentence *LONGER sentences reveal increased phonemic paraphasias Naming: 13/15 *one with phonemic paraphasia self corrected, and one with semantic paraphasia also self corrected  *semantic paraphasia  Pragmatics: Appears intact Sentence Formulation: single word 2/2, 2 words 3/3  Comments: Phonemic and semantic paraphasias throughout assessment and case history. Pt with halting word finding when attempting to generate a sentence.  Interfering components: NA Effective technique: self correcting Non-verbal means of communication: N/A  WRITTEN EXPRESSION: Dominant hand: right Written expression: Not tested  MOTOR SPEECH: Overall motor speech: Appears intact Level of impairment: NA Respiration: thoracic breathing Phonation: normal Resonance: WFL Articulation: Appears intact Intelligibility: Intelligible Motor planning: Appears intact  ORAL MOTOR EXAMINATION: Overall status:  WFL Comments: NA  STANDARDIZED ASSESSMENTS: Portions of this evaluation  were taken from the following standardized tests re: [place]  Test for Differential Diagnosis of Aphasia, [place] Diagnostic Aphasia Examination, Chiropodist, Aphasia Diagnostic Profiles, Reading Comprehension Battery for Aphasia, [place] Brief Inventory of Communication and Cognition, SCATBI, Visiting Nurse Association, and [place].    PATIENT REPORTED OUTCOME MEASURES (PROM): To complete in subsequent sessions                                                                                                                             TREATMENT DATE:     PATIENT EDUCATION: Education details: aphasia, language, SLP Role Person educated: Patient Education method: Explanation Education comprehension: verbalized understanding and needs further education   GOALS: Goals reviewed with patient? No  SHORT TERM GOALS: Target date: 04/22/24  Complete language testing, PROM, and update goals Baseline: Goal status: INITIAL  2.  Pt will recall 2 word finding strategies to compensate for anomia  Baseline:  Goal status: INITIAL  3.   Baseline:  Goal status: INITIAL  4.   Baseline:  Goal status: INITIAL  5.   Baseline:  Goal status: INITIAL  6.   Baseline:  Goal status: INITIAL  LONG TERM GOALS: Target date: 05/22/24  Pt will improve score on PROM Baseline:  Goal status: INITIAL  2.  Pt will utilize anomia compensation during therapy session in instances of anomia Baseline:  Goal status: INITIAL  3.   Baseline:  Goal status: INITIAL  4.   Baseline:  Goal status: INITIAL  5.   Baseline:  Goal status: INITIAL  6.   Baseline:  Goal status: INITIAL  ASSESSMENT:  CLINICAL IMPRESSION: Pt is a 68 yo male who presents to ST OP for evaluation post CVA. Pt endorses majority of difficulty with verbal expression and reading comprehension. He feels he is  understanding all auditory information. Pt was assessed using informal language subtests from standardized assessments - see above for results. Further reading and writing assessment warranted. SLP observed phonemic and semantic paraphasias, as well as, halting word finding difficulty when attempting to generate more complex thoughts. Pt was relatively conversational and was able to generate basic sentence structures without much difficulty. Pt with frustration surrounding difficulty and required cueing to slow down as the self-imposed time pressure resulted in more errors. Pt would like to return to work, but is flexible on time frame. SLP rec skilled ST services to address aphasia..    OBJECTIVE IMPAIRMENTS: include aphasia. These impairments are limiting patient from return to work, managing medications, managing appointments, managing finances, household responsibilities, ADLs/IADLs, and effectively communicating at home and in community. Factors affecting potential to achieve goals and functional outcome are NA. Patient will benefit from skilled SLP services to address above impairments and improve overall function.  REHAB POTENTIAL: Good  PLAN:  SLP FREQUENCY: 1x/week  SLP DURATION: 8 weeks  PLANNED INTERVENTIONS: Language facilitation, Environmental controls, Cueing hierachy, Internal/external aids, Functional tasks, Multimodal communication approach, SLP instruction and feedback, Compensatory  strategies, Patient/family education, and [id number] Treatment of speech (30 or 45 min)     [employer], CCC-SLP 03/22/2024, 12:37 PM

## 2024-03-31 ENCOUNTER — Encounter: Payer: Self-pay | Admitting: Speech Pathology

## 2024-03-31 ENCOUNTER — Ambulatory Visit: Attending: Family Medicine | Admitting: Speech Pathology

## 2024-03-31 DIAGNOSIS — R4701 Aphasia: Secondary | ICD-10-CM | POA: Diagnosis present

## 2024-03-31 NOTE — Therapy (Unsigned)
 OUTPATIENT SPEECH LANGUAGE PATHOLOGY APHASIA TREATMENT   Patient Name: Peter Robinson MRN: [id number] DOB:01/22/1956, 68 y.o., male Today's Date: 03/31/2024  PCP: Peter Miquel Faden, MD REFERRING PROVIDER: Briana Elgin LABOR, MD  END OF SESSION:  End of Session - 03/31/24 1235     Visit Number 2    Date for Recertification  05/22/24    SLP Start Time 1230    SLP Stop Time  1310    SLP Time Calculation (min) 40 min    Activity Tolerance Patient tolerated treatment well          Past Medical History:  Diagnosis Date   Atrial fibrillation (HCC)    Diabetes mellitus without complication (HCC)    Hypertension    Past Surgical History:  Procedure Laterality Date   CATARACT EXTRACTION Bilateral    Patient Active Problem List   Diagnosis Date Noted   Permanent atrial fibrillation (HCC) 03/19/2024   Primary hypertension 03/19/2024   Hyperlipidemia 03/19/2024   Controlled type 2 diabetes mellitus, with long-term current use of insulin  (HCC) 03/19/2024   Acute ischemic stroke (HCC) 03/18/2024   Multifocal pneumonia 02/05/2022    ONSET DATE: Referred on 03/19/24   REFERRING DIAG: P36.587 (ICD-10-CM) - Cerebrovascular accident (CVA) due to embolism of left middle cerebral artery (HCC)   THERAPY DIAG:  Aphasia  Rationale for Evaluation and Treatment: Rehabilitation  SUBJECTIVE:   SUBJECTIVE STATEMENT: Pt reports he has returned to work.   Pt accompanied by: self  PERTINENT HISTORY: Per EMR: Peter Robinson is a 68 y.o. male with a history of atrial fibrillation, CAD, diabetes mellitus type 2, hypertension, hyperlipidemia.   PAIN:  Are you having pain? No  FALLS: Has patient fallen in last 6 months?  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  DIAGNOSTIC FINDINGS: Per EMR  MR BRAIN WO CONTRAST  IMPRESSION: 1. Acute left posterior MCA territory infarcts.   Electronically signed by: Peter Molt MD 03/19/2024  COGNITION: Overall cognitive status: Within functional limits for tasks assessed; reports no changes   AUDITORY COMPREHENSION: Overall auditory comprehension: Relatively intact; however, did take longer to process more complex sentence structure and required repetition.  YES/NO questions: Appears intact Following directions: 2-step 100% Conversation: Moderately Complex Interfering components:   Effective technique: extra processing time and repetition/stressing words  READING COMPREHENSION: Not tested; next session  03/31/24: Tested @ functional paragraph level: Difficulty with recall and comprehension noted. SLP rec slowing  EXPRESSION: verbal  VERBAL EXPRESSION: Level of generative/spontaneous verbalization: conversation Automatic speech: day of week: intact and month of year: intact  Repetition: Impaired: sentence *LONGER sentences reveal increased phonemic paraphasias Naming: 13/15 *one with phonemic paraphasia self corrected, and one with semantic paraphasia also self corrected  *semantic paraphasia  Pragmatics: Appears intact Sentence Formulation: single word 2/2, 2 words 3/3  Comments: Phonemic and semantic paraphasias throughout assessment and case history. Pt with halting word finding when attempting to generate a sentence.  Interfering components: NA Effective technique: self correcting Non-verbal means of communication: N/A  WRITTEN EXPRESSION: Dominant hand: right Written expression: Not tested  Tested 11/5: Impaired at generated paragraph level (mild)   MOTOR SPEECH: Overall motor speech: Appears intact Level of impairment: NA Respiration: thoracic breathing Phonation:  normal Resonance:  WFL Articulation: Appears intact Intelligibility: Intelligible Motor planning: Appears intact  ORAL MOTOR EXAMINATION: Overall status: WFL Comments: NA  STANDARDIZED ASSESSMENTS: Portions of this evaluation were taken from the following standardized tests re: [place]  Test for Differential Diagnosis of Aphasia, [place] Diagnostic Aphasia Examination, Chiropodist, Aphasia Diagnostic Profiles, Reading Comprehension Battery for Aphasia, Burns Brief Inventory of Communication and Cognition, SCATBI, Visiting Nurse Association, and [place].    PATIENT REPORTED OUTCOME MEASURES (PROM):  AIQ: 25/                                                                                                                            TREATMENT DATE:   03/31/24: Pt was seen for skilled ST services targeting aphasia. Pt reports he has returned to work and having some trouble with typing/spelling. He reports he is still struggling with word finding, but it is fluctuating.   PATIENT EDUCATION: Education details: aphasia, language, SLP Role Person educated: Patient Education method: Explanation Education comprehension: verbalized understanding and needs further education   GOALS: Goals reviewed with patient? No  SHORT TERM GOALS: Target date: 04/22/24  Complete language testing, PROM, and update goals Baseline: Goal status: INITIAL  2.  Pt will recall 2 word finding strategies to compensate for anomia  Baseline:  Goal status: INITIAL  3.   Baseline:  Goal status: INITIAL  4.   Baseline:  Goal status: INITIAL  5.   Baseline:  Goal status: INITIAL  6.   Baseline:  Goal status: INITIAL  LONG TERM GOALS: Target date: 05/22/24  Pt will improve score on PROM Baseline:  Goal status: INITIAL  2.  Pt will utilize anomia compensation during therapy session in instances of anomia Baseline:  Goal status: INITIAL  3.   Baseline:   Goal status: INITIAL  4.   Baseline:  Goal status: INITIAL  5.   Baseline:  Goal status: INITIAL  6.   Baseline:  Goal status: INITIAL  ASSESSMENT:  CLINICAL IMPRESSION: Pt is a 68 yo male who presents to ST OP for evaluation post CVA. Pt endorses majority of difficulty with verbal expression and reading comprehension. He feels he is understanding all auditory information. Pt was assessed using informal language subtests from standardized assessments - see above for results. Further reading and writing assessment warranted. SLP observed phonemic and semantic paraphasias, as well as, halting word finding difficulty when attempting to generate more complex thoughts. Pt was relatively conversational and was able to generate basic sentence structures without much difficulty. Pt with frustration surrounding difficulty and required cueing to slow down as the self-imposed time pressure resulted in more errors. Pt would like to return to work, but is flexible on time frame. SLP rec skilled ST services to address aphasia..    OBJECTIVE IMPAIRMENTS: include aphasia. These impairments are limiting patient from return to work, managing medications, managing appointments, managing finances, household responsibilities, ADLs/IADLs, and effectively communicating at home and in community. Factors affecting potential  to achieve goals and functional outcome are NA. Patient will benefit from skilled SLP services to address above impairments and improve overall function.  REHAB POTENTIAL: Good  PLAN:  SLP FREQUENCY: 1x/week  SLP DURATION: 8 weeks  PLANNED INTERVENTIONS: Language facilitation, Environmental controls, Cueing hierachy, Internal/external aids, Functional tasks, Multimodal communication approach, SLP instruction and feedback, Compensatory strategies, Patient/family education, and [id number] Treatment of speech (30 or 45 min)     [employer], CCC-SLP 03/31/2024, 12:36 PM

## 2024-04-08 ENCOUNTER — Encounter: Payer: Self-pay | Admitting: Speech Pathology

## 2024-04-08 ENCOUNTER — Ambulatory Visit: Admitting: Speech Pathology

## 2024-04-08 DIAGNOSIS — R4701 Aphasia: Secondary | ICD-10-CM

## 2024-04-08 NOTE — Therapy (Signed)
 OUTPATIENT SPEECH LANGUAGE PATHOLOGY APHASIA TREATMENT   Patient Name: Peter Robinson MRN: [id number] DOB:11/10/1955, 68 y.o., male Today's Date: 04/08/2024  PCP: Peter Miquel Faden, MD REFERRING PROVIDER: Briana Elgin LABOR, MD  END OF SESSION:  End of Session - 04/08/24 0928     Visit Number 3    Date for Recertification  05/22/24    SLP Start Time 0930    SLP Stop Time  1010    SLP Time Calculation (min) 40 min    Activity Tolerance Patient tolerated treatment well          Past Medical History:  Diagnosis Date   Atrial fibrillation (HCC)    Diabetes mellitus without complication (HCC)    Hypertension    Past Surgical History:  Procedure Laterality Date   CATARACT EXTRACTION Bilateral    Patient Active Problem List   Diagnosis Date Noted   Permanent atrial fibrillation (HCC) 03/19/2024   Primary hypertension 03/19/2024   Hyperlipidemia 03/19/2024   Controlled type 2 diabetes mellitus, with long-term current use of insulin  (HCC) 03/19/2024   Acute ischemic stroke (HCC) 03/18/2024   Multifocal pneumonia 02/05/2022    ONSET DATE: Referred on 03/19/24   REFERRING DIAG: P36.587 (ICD-10-CM) - Cerebrovascular accident (CVA) due to embolism of left middle cerebral artery (HCC)   THERAPY DIAG:  Aphasia  Rationale for Evaluation and Treatment: Rehabilitation  SUBJECTIVE:   SUBJECTIVE STATEMENT: I feel like I am getting better.  Pt accompanied by: self  PERTINENT HISTORY: Per EMR: Peter Robinson is a 68 y.o. male with a history of atrial fibrillation, CAD, diabetes mellitus type 2, hypertension, hyperlipidemia.   PAIN:  Are you having pain? Yes: NPRS scale: 3/10 Pain location: head Pain description: aching pain Aggravating factors: N/A Relieving factors: N/A  FALLS: Has patient fallen in last 6 months?  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  DIAGNOSTIC FINDINGS: Per EMR  MR BRAIN WO CONTRAST  IMPRESSION: 1. Acute left posterior MCA territory infarcts.   Electronically signed by: Peter Molt MD 03/19/2024  COGNITION: Overall cognitive status: Within functional limits for tasks assessed; reports no changes   AUDITORY COMPREHENSION: Overall auditory comprehension: Relatively intact; however, did take longer to process more complex sentence structure and required repetition.  YES/NO questions: Appears intact Following directions: 2-step 100% Conversation: Moderately Complex Interfering components:   Effective technique: extra processing time and repetition/stressing words  READING COMPREHENSION: Not tested; next session  03/31/24: Tested @ functional paragraph level: Difficulty with recall and comprehension noted. SLP rec slowing  EXPRESSION: verbal  VERBAL EXPRESSION: Level of generative/spontaneous verbalization: conversation Automatic speech: day of week: intact and month of year: intact  Repetition: Impaired: sentence *LONGER sentences reveal increased phonemic paraphasias Naming: 13/15 *one with phonemic paraphasia self corrected, and one with semantic paraphasia also self corrected  *semantic paraphasia  Pragmatics: Appears intact Sentence Formulation: single word 2/2, 2 words 3/3  Comments: Phonemic and semantic paraphasias throughout assessment and case history. Pt with halting word finding when attempting to generate a sentence.  Interfering components: NA Effective technique: self correcting Non-verbal means of communication: N/A  WRITTEN EXPRESSION: Dominant hand: right Written expression: Not tested  Tested 11/5: Impaired at generated paragraph level (mild)   MOTOR  SPEECH:  Overall motor speech: Appears intact Level of impairment: NA Respiration: thoracic breathing Phonation: normal Resonance: WFL Articulation: Appears intact Intelligibility: Intelligible Motor planning: Appears intact  ORAL MOTOR EXAMINATION: Overall status: WFL Comments: NA  STANDARDIZED ASSESSMENTS: Portions of this evaluation were taken from the following standardized tests re: [place]  Test for Differential Diagnosis of Aphasia, [place] Diagnostic Aphasia Examination, Chiropodist, Aphasia Diagnostic Profiles, Reading Comprehension Battery for Aphasia, Burns Brief Inventory of Communication and Cognition, SCATBI, Visiting Nurse Association, and [place].    PATIENT REPORTED OUTCOME MEASURES (PROM):  AIQ: 25                                                                                                                            TREATMENT DATE:   04/08/24: Pt was seen for skilled ST services targeting aphasia. Pt reports he doesn't remember any days since he has last been here that were bad days. He reports he has found that if he begins speaking and he is not really paying attention, then he flows a lot better. If he gets nervous, then he struggles a little more. His reading comprehension and typing has improved immensely. Pt reports he has been using the pencil to touch each word. He reports he hasn't had a lot of word finding difficulty because he shuts down a little bit. SLP initiated education on word finding strategies. Pt reports he is most likely to try delay. Pt with noted phonemic paraphasia on the word exercise x3 different trials of using the word. No other sx noted.   03/31/24: Pt was seen for skilled ST services targeting aphasia. Pt reports he has returned to work and having some trouble with typing/spelling. He reports he is still struggling with word finding, but it is fluctuating. SLP assessed writing and reading  comprehension. Pt reports he doesn't have trouble with comprehension, though was noted to ask what or for clarification throughout session. He reports no trouble with hearing. SLP educated on reading strategies re: enlarging font, reducing visual distraction with sheet of paper, touching each word as it is read aloud. Pt is noted to rush through all tasks which contribute to errors. SLP continues to encourage pt to slow rate of reading, speech (by adding pauses), and writing to reduce errors. To cont with education next session.   PATIENT EDUCATION: Education details: aphasia, language, SLP Role Person educated: Patient Education method: Explanation Education comprehension: verbalized understanding and needs further education   GOALS: Goals reviewed with patient? No  SHORT TERM GOALS: Target date: 04/22/24  Complete language testing, PROM, and update goals Baseline: Goal status: INITIAL  2.  Pt will recall 2 word finding strategies to compensate for anomia  Baseline:  Goal status: INITIAL  3.  Pt will verbalize 2+ reading comprehension strategies  Baseline:  Goal status: INITIAL  4.  Pt will verbalize 2+ writing compensations for spelling  Baseline:  Goal status: INITIAL    LONG TERM GOALS: Target date:  05/22/24  Pt will improve score on PROM Baseline:  Goal status: INITIAL  2.  Pt will utilize anomia compensation during therapy session in instances of anomia Baseline:  Goal status: INITIAL  3.  Pt will demonstrate use of reading comprehension strategies in session Baseline:  Goal status: INITIAL  4.  Pt will report successful use of writing compensations at home/work Baseline:  Goal status: INITIAL   ASSESSMENT:  CLINICAL IMPRESSION: Pt is a 68 yo male who presents to ST OP for evaluation post CVA. Pt endorses majority of difficulty with verbal expression and reading comprehension. He feels he is understanding all auditory information. Pt was assessed using  informal language subtests from standardized assessments - see above for results. Further reading and writing assessment warranted. SLP observed phonemic and semantic paraphasias, as well as, halting word finding difficulty when attempting to generate more complex thoughts. Pt was relatively conversational and was able to generate basic sentence structures without much difficulty. Pt with frustration surrounding difficulty and required cueing to slow down as the self-imposed time pressure resulted in more errors. Pt would like to return to work, but is flexible on time frame. SLP rec skilled ST services to address aphasia..    OBJECTIVE IMPAIRMENTS: include aphasia. These impairments are limiting patient from return to work, managing medications, managing appointments, managing finances, household responsibilities, ADLs/IADLs, and effectively communicating at home and in community. Factors affecting potential to achieve goals and functional outcome are NA. Patient will benefit from skilled SLP services to address above impairments and improve overall function.  REHAB POTENTIAL: Good  PLAN:  SLP FREQUENCY: 1x/week  SLP DURATION: 8 weeks  PLANNED INTERVENTIONS: Language facilitation, Environmental controls, Cueing hierachy, Internal/external aids, Functional tasks, Multimodal communication approach, SLP instruction and feedback, Compensatory strategies, Patient/family education, and [id number] Treatment of speech (30 or 45 min)     [employer], CCC-SLP 04/08/2024, 9:29 AM

## 2024-04-15 ENCOUNTER — Encounter: Payer: Self-pay | Admitting: Speech Pathology

## 2024-04-15 ENCOUNTER — Ambulatory Visit: Admitting: Speech Pathology

## 2024-04-15 DIAGNOSIS — R4701 Aphasia: Secondary | ICD-10-CM

## 2024-04-15 NOTE — Therapy (Signed)
 OUTPATIENT SPEECH LANGUAGE PATHOLOGY APHASIA TREATMENT   Patient Name: Peter Robinson MRN: [id number] DOB:March 29, 1956, 68 y.o., male Today's Date: 04/15/2024  PCP: Marsa Miquel Faden, MD REFERRING PROVIDER: Briana Elgin LABOR, MD  END OF SESSION:  End of Session - 04/15/24 1233     Visit Number 4    Date for Recertification  05/22/24    SLP Start Time 1230    SLP Stop Time  1310    SLP Time Calculation (min) 40 min    Activity Tolerance Patient tolerated treatment well          Past Medical History:  Diagnosis Date   Atrial fibrillation (HCC)    Diabetes mellitus without complication (HCC)    Hypertension    Past Surgical History:  Procedure Laterality Date   CATARACT EXTRACTION Bilateral    Patient Active Problem List   Diagnosis Date Noted   Permanent atrial fibrillation (HCC) 03/19/2024   Primary hypertension 03/19/2024   Hyperlipidemia 03/19/2024   Controlled type 2 diabetes mellitus, with long-term current use of insulin  (HCC) 03/19/2024   Acute ischemic stroke (HCC) 03/18/2024   Multifocal pneumonia 02/05/2022    ONSET DATE: Referred on 03/19/24   REFERRING DIAG: P36.587 (ICD-10-CM) - Cerebrovascular accident (CVA) due to embolism of left middle cerebral artery (HCC)   THERAPY DIAG:  Aphasia  Rationale for Evaluation and Treatment: Rehabilitation  SUBJECTIVE:   SUBJECTIVE STATEMENT: I have terrible sleep habits  Pt accompanied by: self  PERTINENT HISTORY: Per EMR: Peter Robinson is a 68 y.o. male with a history of atrial fibrillation, CAD, diabetes mellitus type 2, hypertension, hyperlipidemia.   PAIN:  Are you having pain? Yes: NPRS scale: 2/10 Pain location: head Pain description: aching pain Aggravating factors: N/A Relieving factors: N/A  FALLS: Has patient fallen in last 6 months?  No  LIVING ENVIRONMENT: Lives with: lives alone; LT girlfriend engineer, manufacturing) Lives in: House/apartment  PLOF:  Level of assistance: Independent with ADLs,  Independent with IADLs Employment: Full-time employment; [employer] - plans to return to work  PATIENT GOALS: language  OBJECTIVE:  Note: Objective measures were completed at Evaluation unless otherwise noted.  DIAGNOSTIC FINDINGS: Per EMR  MR BRAIN WO CONTRAST  IMPRESSION: 1. Acute left posterior MCA territory infarcts.   Electronically signed by: Gilmore Molt MD 03/19/2024  COGNITION: Overall cognitive status: Within functional limits for tasks assessed; reports no changes   AUDITORY COMPREHENSION: Overall auditory comprehension: Relatively intact; however, did take longer to process more complex sentence structure and required repetition.  YES/NO questions: Appears intact Following directions: 2-step 100% Conversation: Moderately Complex Interfering components:   Effective technique: extra processing time and repetition/stressing words  READING COMPREHENSION: Not tested; next session  03/31/24: Tested @ functional paragraph level: Difficulty with recall and comprehension noted. SLP rec slowing  EXPRESSION: verbal  VERBAL EXPRESSION: Level of generative/spontaneous verbalization: conversation Automatic speech: day of week: intact and month of year: intact  Repetition: Impaired: sentence *LONGER sentences reveal increased phonemic paraphasias Naming: 13/15 *one with phonemic paraphasia self corrected, and one with semantic paraphasia also self corrected  *semantic paraphasia  Pragmatics: Appears intact Sentence Formulation: single word 2/2, 2 words 3/3  Comments: Phonemic and semantic paraphasias throughout assessment and case history. Pt with halting word finding when attempting to generate a sentence.  Interfering components: NA Effective technique: self correcting Non-verbal means of communication: N/A  WRITTEN EXPRESSION: Dominant hand: right Written expression: Not tested  Tested 11/5: Impaired at generated paragraph level (mild)   MOTOR  SPEECH: Overall motor  speech: Appears intact Level of impairment: NA Respiration: thoracic breathing Phonation: normal Resonance: WFL Articulation: Appears intact Intelligibility: Intelligible Motor planning: Appears intact  ORAL MOTOR EXAMINATION: Overall status: WFL Comments: NA  STANDARDIZED ASSESSMENTS: Portions of this evaluation were taken from the following standardized tests re: [place]  Test for Differential Diagnosis of Aphasia, [place] Diagnostic Aphasia Examination, Chiropodist, Aphasia Diagnostic Profiles, Reading Comprehension Battery for Aphasia, [place] Brief Inventory of Communication and Cognition, SCATBI, Visiting Nurse Association, and [place].    PATIENT REPORTED OUTCOME MEASURES (PROM):  AIQ: 25                                                                                                                            TREATMENT DATE:   04/15/24: Pt was seen for skilled ST services targeting aphasia. Pt reports continued struggles with insomnia. 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 Pt reports his partner feels like his communication is improving. Pt reports he has been avoiding fast moving/intellectual conversations. Feels embarrassment about anticipated errors. Completed education on word finding strategies. After further discussion, it appears pt is having more difficulty with thought organization vs word finding. To begin thought organization/expression strategies next session.   04/08/24: Pt was seen for skilled ST services targeting aphasia. Pt reports he doesn't remember any days since he has last been here that were bad days. He reports he has found that if he begins speaking and he is not really paying attention, then he flows a lot better. If he gets nervous, then he struggles a little more. His reading comprehension and typing has improved immensely. Pt reports he has  been using the pencil to touch each word. He reports he hasn't had a lot of word finding difficulty because he shuts down a little bit. SLP initiated education on word finding strategies. Pt reports he is most likely to try delay. Pt with noted phonemic paraphasia on the word exercise x3 different trials of using the word. No other sx noted.   03/31/24: Pt was seen for skilled ST services targeting aphasia. Pt reports he has returned to work and having some trouble with typing/spelling. He reports he is still struggling with word finding, but it is fluctuating. SLP assessed writing and reading comprehension. Pt reports he doesn't have trouble with comprehension, though was noted to ask what or for clarification throughout session. He reports no trouble with hearing. SLP educated on reading strategies re: enlarging font, reducing visual distraction with sheet of paper, touching each word as it is read aloud. Pt is noted to rush through all tasks which contribute to errors. SLP continues to encourage pt to slow rate of reading, speech (by adding pauses), and writing to reduce errors. To cont with education next session.   PATIENT EDUCATION: Education details: aphasia, language, SLP Role Person educated: Patient Education method: Explanation Education comprehension: verbalized understanding and needs further  education   GOALS: Goals reviewed with patient? No  SHORT TERM GOALS: Target date: 04/22/24  Complete language testing, PROM, and update goals Baseline: Goal status: INITIAL  2.  Pt will recall 2 word finding strategies to compensate for anomia  Baseline:  Goal status: INITIAL  3.  Pt will verbalize 2+ reading comprehension strategies  Baseline:  Goal status: INITIAL  4.  Pt will verbalize 2+ writing compensations for spelling  Baseline:  Goal status: INITIAL    LONG TERM GOALS: Target date: 05/22/24  Pt will improve score on PROM Baseline:  Goal status: INITIAL  2.  Pt  will utilize anomia compensation during therapy session in instances of anomia Baseline:  Goal status: INITIAL  3.  Pt will demonstrate use of reading comprehension strategies in session Baseline:  Goal status: INITIAL  4.  Pt will report successful use of writing compensations at home/work Baseline:  Goal status: INITIAL   ASSESSMENT:  CLINICAL IMPRESSION: Pt is a 68 yo male who presents to ST OP for evaluation post CVA. Pt endorses majority of difficulty with verbal expression and reading comprehension. He feels he is understanding all auditory information. Pt was assessed using informal language subtests from standardized assessments - see above for results. Further reading and writing assessment warranted. SLP observed phonemic and semantic paraphasias, as well as, halting word finding difficulty when attempting to generate more complex thoughts. Pt was relatively conversational and was able to generate basic sentence structures without much difficulty. Pt with frustration surrounding difficulty and required cueing to slow down as the self-imposed time pressure resulted in more errors. Pt would like to return to work, but is flexible on time frame. SLP rec skilled ST services to address aphasia..    OBJECTIVE IMPAIRMENTS: include aphasia. These impairments are limiting patient from return to work, managing medications, managing appointments, managing finances, household responsibilities, ADLs/IADLs, and effectively communicating at home and in community. Factors affecting potential to achieve goals and functional outcome are NA. Patient will benefit from skilled SLP services to address above impairments and improve overall function.  REHAB POTENTIAL: Good  PLAN:  SLP FREQUENCY: 1x/week  SLP DURATION: 8 weeks  PLANNED INTERVENTIONS: Language facilitation, Environmental controls, Cueing hierachy, Internal/external aids, Functional tasks, Multimodal communication approach, SLP  instruction and feedback, Compensatory strategies, Patient/family education, and [id number] Treatment of speech (30 or 45 min)     [employer], CCC-SLP 04/15/2024, 12:34 PM

## 2024-04-20 NOTE — Progress Notes (Deleted)
 [facility] [address] [place]. Peter Robinson [address] [phone]       HOSPITAL FOLLOW UP NOTE  [name] Date of Birth: 01/17/1956 Medical Record Number: [id number]   Reason for Referral:  hospital stroke follow up    SUBJECTIVE:   CHIEF COMPLAINT:  No chief complaint on file.   HPI:   Peter Robinson is a 68 y.o. who  has a past medical history of Atrial fibrillation (HCC), Diabetes mellitus without complication (HCC), and Hypertension.  Patient presented on 10823/2025 with speech and word finding difficulty. He had not taken Eliquis  for atrial fib. MRI brain showed acute left posterior MCA territory infarcts. 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 LDL of 93. Hemoglobin A1C of 6.6%. Transthoracic Echocardiogram significant for no atrial level shunt. Neurology recommendations for Eliquis  and to continue Lipitor. PT/OT recommendations for no follow-up. SLP recommendation for outpatient speech therapy. Personally reviewed hospitalization pertinent progress notes, lab work and imaging.  Evaluated by Dr Rosemarie.   Since discharge,   ST  BP? He continues carvedilol , furosemide , amlodipine  and losartan . He continues atorvastatin  and tolerating it well. A1C well managed on metformin, Lantus and glipizide.   He has a history of sleep apnea. He has not used CPAP, recently    PERTINENT IMAGING/LABS  CTA head & neck occlusion of small proximal M3 branch of the left MCA.  Mild stenosis of the right paraclinoid ICA.  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  Mild chronic microvascular ischemic changes. MRI acute left posterior MCA territory infarcts 2D Echo 50-55%   A1C Lab Results  Component Value Date   HGBA1C 6.6 (H) 03/19/2024    Lipid Panel     Component  Value Date/Time   CHOL 156 03/19/2024 0124   TRIG 119 03/19/2024 0124   HDL 39 (L) 03/19/2024 0124   CHOLHDL 4.0 03/19/2024 0124   VLDL 24 03/19/2024 0124   LDLCALC 93 03/19/2024 0124      ROS:   14 system review of systems performed and negative with exception of those listed in HPI  PMH:  Past Medical History:  Diagnosis Date   Atrial fibrillation (HCC)    Diabetes mellitus without complication (HCC)    Hypertension     PSH:  Past Surgical History:  Procedure Laterality Date   CATARACT EXTRACTION Bilateral     Social History:  Social History   Socioeconomic History   Marital status: Single    Spouse name: Not on file   Number of children: Not on file   Years of education: Not on file   Highest education level: Not on file  Occupational History   Not on file  Tobacco Use   Smoking status: Former    Types: Cigarettes   Smokeless tobacco: Never  Vaping Use   Vaping status: Never Used  Substance and Sexual Activity   Alcohol use: Yes    Comment: occ   Drug use: Never   Sexual activity: Not on file  Other Topics Concern   Not on file  Social History Narrative   Not on file   Social Drivers of Health   Financial Resource Strain: Not on file  Food Insecurity: No Food Insecurity (03/18/2024)   Hunger Vital Sign    Worried About Running Out of Food in the Last  Year: Never true    Ran Out of Food in the Last Year: Never true  Transportation Needs: No Transportation Needs (03/18/2024)   PRAPARE - Administrator, Civil Service (Medical): No    Lack of Transportation (Non-Medical): No  Physical Activity: Not on file  Stress: No Stress Concern Present (08/08/2021)   Received from [facility] - Occupational Stress Questionnaire    Feeling of Stress : Not at all  Social Connections: Unknown (03/19/2024)   Social Connection and Isolation Panel    Frequency of Communication with Friends and Family: Not on  file    Frequency of Social Gatherings with Friends and Family: Not on file    Attends Religious Services: Not on file    Active Member of Clubs or Organizations: Not on file    Attends Club or Organization Meetings: Never    Marital Status: Divorced  Intimate Partner Violence: Unknown (03/18/2024)   Humiliation, Afraid, Rape, and Kick questionnaire    Fear of Current or Ex-Partner: No    Emotionally Abused: No    Physically Abused: Not on file    Sexually Abused: No    Family History: No family history on file.  Medications:   Current Outpatient Medications on File Prior to Visit  Medication Sig Dispense Refill   amLODipine  (NORVASC ) 2.5 MG tablet Take 2.5 mg by mouth daily.     atorvastatin  (LIPITOR) 40 MG tablet Take 40 mg by mouth daily.     carvedilol  (COREG ) 12.5 MG tablet Take 12.5 mg by mouth 2 (two) times daily.     diclofenac (VOLTAREN) 75 MG EC tablet Take 75 mg by mouth 2 (two) times daily as needed for mild pain (pain score 1-3) (playing golf).     doxazosin (CARDURA) 2 MG tablet Take 2 mg by mouth daily.     ELIQUIS  5 MG TABS tablet Take 1 tablet (5 mg total) by mouth 2 (two) times daily. 180 tablet 0   furosemide  (LASIX ) 20 MG tablet Take 20 mg by mouth daily.     gabapentin  (NEURONTIN ) 600 MG tablet Take 1,200 mg by mouth at bedtime.     glipiZIDE (GLUCOTROL XL) 5 MG 24 hr tablet Take 5 mg by mouth daily with breakfast.     LANTUS SOLOSTAR 100 UNIT/ML Solostar Pen Inject 20-22 Units into the skin daily.     losartan  (COZAAR ) 100 MG tablet Take 100 mg by mouth daily.     metFORMIN (GLUCOPHAGE-XR) 500 MG 24 hr tablet Take 500 mg by mouth daily.     Multiple Vitamin (MULTIVITAMIN WITH MINERALS) TABS tablet Take 1 tablet by mouth daily.     predniSONE (DELTASONE) 20 MG tablet Take 20 mg by mouth daily as needed (for playing golf).     No current facility-administered medications on file prior to visit.    Allergies:  No Known Allergies    OBJECTIVE:  Physical  Exam  There were no vitals filed for this visit. There is no height or weight on file to calculate BMI. No results found.      No data to display           General: well developed, well nourished, seated, in no evident distress Head: head normocephalic and atraumatic.   Neck: supple with no carotid or supraclavicular bruits Cardiovascular: regular rate and rhythm, no murmurs Musculoskeletal: no deformity Skin:  no rash/petichiae Vascular:  Normal pulses all extremities   Neurologic Exam Mental  Status: Awake and fully alert.  Fluent speech and language.  Oriented to place and time. Recent and remote memory intact. Attention span, concentration and fund of knowledge appropriate. Mood and affect appropriate.  Cranial Nerves: Fundoscopic exam reveals sharp disc margins. Pupils equal, briskly reactive to light. Extraocular movements full without nystagmus. Visual fields full to confrontation. Hearing intact. Facial sensation intact. Face, tongue, palate moves normally and symmetrically.  Motor: Normal bulk and tone. Normal strength in all tested extremity muscles Sensory.: intact to touch , pinprick , position and vibratory sensation.  Coordination: Rapid alternating movements normal in all extremities. Finger-to-nose and heel-to-shin performed accurately bilaterally. Gait and Station: Arises from chair without difficulty. Stance is normal. Gait demonstrates normal stride length and balance with ***. Tandem walk and heel toe ***.  Reflexes: 1+ and symmetric.    NIHSS  *** Modified Rankin  ***    ASSESSMENT: Peter Robinson is a 68 y.o. year old male presenting with speech and word finding difficulty. Vascular risk factors include afib, nonobstructive CAD, DMT2, HTN, HLD.      PLAN:  Acute Ischemic Infarct:  left posterior MCA territory infarcts. Etiology: Atrial fibrillation not on anticoagulation (patient self discontinued Eliquis ): Residual deficit: ***. Continue Eliquis   (apixaban ) daily and atorvastatin  40mg  daily for secondary stroke prevention. Discussed secondary stroke prevention measures and importance of close PCP follow up for aggressive stroke risk factor management. I have gone over the pathophysiology of stroke, warning signs and symptoms, risk factors and their management in some detail with instructions to go to the closest emergency room for symptoms of concern. HTN: BP goal <130/90.  Stable on carvedilol , furosemide , amlodipine  and losartan . Continue per PCP HLD: LDL goal <70. Recent LDL 93. Continue atorvastatin  40mg  daily per PCP.  DMII: A1c goal<7.0. Recent A1c 6.6. At goal. Continue metformin, Lantus and glipizide.  Sleep apnea:    Follow up in *** or call earlier if needed   CC:  [facility] provider: Dr. Rosemarie PCP: Marsa Miquel Faden, MD    I spent *** minutes of face-to-face and non-face-to-face time with patient.  This included previsit chart review including review of recent hospitalization, lab review, study review, order entry, electronic health record documentation, patient education regarding recent stroke including etiology, secondary stroke prevention measures and importance of managing stroke risk factors, residual deficits and typical recovery time and answered all other questions to patient satisfaction   Greig Forbes, [facility]  [facility] [address] [place], [place] [address]  Phone [phone] Fax [phone] Note: This document was prepared with digital dictation and possible smart phrase technology. Any transcriptional errors that result from this process are unintentional.

## 2024-04-20 NOTE — Patient Instructions (Incomplete)

## 2024-04-21 NOTE — Progress Notes (Signed)
 [facility] Heart and Vascular Cardiology Office Note   04/21/2024  PCP:  VEAR DELENA Blunt, MD  Referring Provider:  VEAR DELENA Blunt, MD   Portions of this note were dictated using DRAGON voice recognition software. Please disregard any errors in transcription.  This record has been created using Conservation officer, historic buildings. Errors have been sought and corrected, but may not always be located. Such creation errors do not reflect on the standard of medical care.  Patient ID: Peter Robinson is a 68 y.o. male.  Chief Complaint:  Chief Complaint  Patient presents with  . Follow-up    A fib      Assessment and Plan:    1. Palpitations (Primary) - ECG 12 lead  2. Longstanding persistent atrial fibrillation    (CMD) - Holter monitor - 3-7 days; Future - Ambulatory referral to Cardiac Electrophysiology; Future  3. 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 Current use of long term anticoagulation-Eliquis  for afib  5. Dyslipidemia  6. Essential hypertension  7. History of CVA (cerebrovascular accident)  8. Episodic lightheadedness - Holter monitor - 3-7 days; Future   Return in about 3 months (around 07/22/2024).   PLAN:       Mr. Cadenhead having episodes of sudden unsteady gait and lightheadedness.  Recently suffered a stroke and I would suspect this may be more related to his recent CVA than a cardiac etiology.  He remains in atrial fibrillation with a controlled ventricular response.  His heart rate here in the office today is in the 80s.  I will make arrangements for him to wear a Zio patch for 7 days to assess for any significant tachycardia or bradycardia but apparently none was captured during his recent hospital stay.  He has an appointment with his neurologist in follow-up of his recent stroke next Monday.  I have urged him to keep that appointment.  It sounds like he probably needs some further imaging to be sure he has not had a  cerebellar event of some sort.  Now taking his Eliquis  regularly.  He admits he had not taken it for a period of time and then was taking it once a day.  He remains in atrial fibrillation.  The rate seems to be controlled.  At his last visit, a year ago, we had discussed having him see one of the electrophysiologist for an opinion on restoring sinus rhythm.  Somehow this appointment was never made or.  I will again send a referral request to see if we can get one of the electrophysiologist to give an opinion as to whether they think an attempt at restoring sinus rhythm would be warranted.  He is not describing any anginal sounding chest discomfort.  His cardiac catheterization in 2021 showed no ischemia.  A myocardial perfusion scan last year showed no ischemia.  His blood pressure seems to be reasonably controlled on his current medical regimen.  He is still untreated for his sleep apnea having failed therapy for that.  I will see him back in 3 months.  He should see one of the electrophysiologist in the interim.   Subjective:    Peter Robinson returns for further evaluation and management of his/her cardiac issues.  He is a 68 year old male with a history of longstanding persistent atrial fibrillation, hypertension and dyslipidemia.  I began seeing him a couple of years ago.  At that point he had been in atrial fibrillation for an unknown duration  of time.  He was asymptomatic and he opted to continue with a strategy of rate control and anticoagulation.  In 2021 he describes some chest discomfort.  A Myocard perfusion scan suggested transient ischemic dilatation.  He underwent a cardiac catheterization demonstrating mild nonobstructive coronary disease with normal LV function.  He was doing well when I saw him last year and continued on anticoagulation and rate control medications.  At that point, we had discussed a referral to an electrophysiologist to consider restoring sinus rhythm  and he expressed an interest.  Somehow that appointment was never arranged or not.  Over the last year, he has not done well.  He admits he has not been compliant with his Eliquis .  He stopped taking it for period of time and then resumed taking it once a day.  About a month ago, he suffered a CVA.  He was evaluated and treated at the [facility]  His workup there confirmed a stroke.  He was in atrial fibrillation and his anticoagulation resumed.  No other significant abnormalities were apparently identified during that hospital stay.  Over the last several days he has been experiencing episodes where he suddenly feels very unsteady and dizzy.  The episodes may last 10 minutes or so and then seem to resolve and then he may do well the rest of the day.  He still sees Dr. Marsa for management of his blood pressure and cholesterol.  He has an appoint with his neurologist next Monday in follow-up of the recent stroke  Review of Systems  Pertinent items are noted in HPI.   Medical History  Problem List[1]  Social History  Tobacco Use History[2]  Medications  Current Medications[3]  Allergies  Allergies[4]   Objective:    Vital Signs  BP 146/88 (BP Location: Left arm, Patient Position: Sitting)   Pulse 88   Ht 1.829 m (6')   Wt 95.3 kg (210 lb)   SpO2 93%   BMI 28.48 kg/m  Body mass index is 28.48 kg/m.  Wt Readings from Last 3 Encounters:  04/21/24 95.3 kg (210 lb)  03/05/23 94.3 kg (208 lb)  09/03/22 90.7 kg (200 lb)    Physical Exam  Constitutional: well developed  male seated comfortably in the exam room. EENT:   Eyes appear normal. Ears appear normal, normal nose Neck: Supple without LAD, TM, JVD, or HJR. Respiratory: CTA bilaterally with normal WOB. Cardiovascular: Irregular RR normal S1 and S2 without m/r/g. Abdominal: NABS.  Soft, NT/ND without HSM. Extremities: 2+ radial, PT, and DP pulses bilaterally.  No LE edema. Neurologic: No gross  abnormalities Musculoskeletal:  Walks with a normal gait   Lab Review   HX SODIUM  Date/Time Value Ref Range Status  11/16/2019 03:24 PM 144 135 - 146 MMOL/L Final   HX POTASSIUM  Date/Time Value Ref Range Status  11/16/2019 03:24 PM 4.1 3.5 - 5.3 MMOL/L Final   HX BUN  Date/Time Value Ref Range Status  11/16/2019 03:24 PM 17 8 - 24 MG/DL Final   HX CREATININE  Date/Time Value Ref Range Status  11/16/2019 03:24 PM 1.33 0.50 - 1.50 MG/DL Final   HX HGB  Date/Time Value Ref Range Status  11/16/2019 03:24 PM 14.3 14.0 - 17.5 G/DL Final   HX HCT  Date/Time Value Ref Range Status  11/16/2019 03:24 PM 41.4 (L) 41.5 - 50.4 % Final   HX PLT  Date/Time Value Ref Range Status  11/16/2019 03:24 PM 250 150 - 450 X 10*3/uL  Final     The ASCVD Risk score (Arnett DK, et al., 2019) failed to calculate for the following reasons:   Risk score cannot be calculated because patient has a medical history suggesting prior/existing ASCVD   Debby Isla Manner, MD MD [facility]      [1] Patient Active Problem List Diagnosis  . Palpitations  . Essential hypertension  . Dyslipidemia  . Longstanding persistent atrial fibrillation    (CMD)  . Current use of long term anticoagulation-Eliquis  for afib  . Chest discomfort  . Abnormal myocardial perfusion study  . OSA (obstructive sleep apnea)  . 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 Seborrheic keratoses  . Sun-damaged skin  . Cherry angioma  . Multiple benign nevi  . Folliculitis  . History of CVA (cerebrovascular accident)  . Episodic lightheadedness  [2] Social History Tobacco Use  Smoking Status Former  . Current packs/day: 0.00  . Average packs/day: 0.3 packs/day  . Types: Cigarettes  . Quit date: 11/22/2017  . Years since quitting: 6.4  Smokeless Tobacco Never  [3] Current Outpatient Medications  Medication Sig Dispense Refill  . amLODIPine  (NORVASC ) 5 mg tablet Take 5 mg by mouth Once Daily. 60  tablet 6  . apixaban  (Eliquis ) 5 mg tab Take 1 tablet (5 mg total) by mouth 2 (two) times a day. 180 tablet 1  . atorvastatin  (LIPITOR) 40 mg tablet Take 40 mg by mouth Once Daily.    . carvediloL  (COREG ) 12.5 mg tablet Take 12.5 mg by mouth 2 (two) times a day.    SABRA doxycycline (VIBRAMYCIN) 50 mg cap TAKE 1 CAPSULE IN THE EVENING 30 MINUTES AFTER DINNER 30 capsule 1  . furosemide  (LASIX ) 10 mg/mL solution Take 0.5 mg/kg by mouth Once Daily.    SABRA glipiZIDE (GLUCOTROL XL) 10 mg 24 hr tablet Take 10 mg by mouth Once Daily.    . losartan  (COZAAR ) 100 mg tablet Take 100 mg by mouth Once Daily.    . metFORMIN (GLUCOPHAGE) 500 mg tablet Take 1,000 mg by mouth daily with breakfast.    . [place] FlexTouch U-100 100 unit/mL (3 mL) pen Inject under the skin daily.    . clindamycin (CLEOCIN T) 1 % solution APPLY SMALL AMOUNT TO BEARD AREA DAILY FOR BUMPS (Patient not taking: Reported on 04/21/2024) 60 mL 1  . doxazosin (CARDURA) 2 mg tablet Take 2 mg by mouth. (Patient not taking: Reported on 04/21/2024)  0  . tretinoin (Retin-A) 0.025 % cream Apply pea-sized amount to entire face nightly. If too drying, apply every other night or every 3 nights. (Patient not taking: Reported on 04/21/2024) 45 g 5   No current facility-administered medications for this visit.  [4] No Known Allergies

## 2024-04-26 ENCOUNTER — Emergency Department (HOSPITAL_BASED_OUTPATIENT_CLINIC_OR_DEPARTMENT_OTHER)

## 2024-04-26 ENCOUNTER — Inpatient Hospital Stay (HOSPITAL_COMMUNITY)

## 2024-04-26 ENCOUNTER — Emergency Department (HOSPITAL_COMMUNITY)

## 2024-04-26 ENCOUNTER — Encounter (HOSPITAL_BASED_OUTPATIENT_CLINIC_OR_DEPARTMENT_OTHER): Payer: Self-pay

## 2024-04-26 ENCOUNTER — Observation Stay (HOSPITAL_BASED_OUTPATIENT_CLINIC_OR_DEPARTMENT_OTHER)
Admission: EM | Admit: 2024-04-26 | Discharge: 2024-04-27 | Disposition: A | Attending: Family Medicine | Admitting: Family Medicine

## 2024-04-26 ENCOUNTER — Other Ambulatory Visit: Payer: Self-pay

## 2024-04-26 ENCOUNTER — Inpatient Hospital Stay: Admitting: Family Medicine

## 2024-04-26 DIAGNOSIS — I6389 Other cerebral infarction: Principal | ICD-10-CM | POA: Insufficient documentation

## 2024-04-26 DIAGNOSIS — I4819 Other persistent atrial fibrillation: Principal | ICD-10-CM

## 2024-04-26 DIAGNOSIS — F1090 Alcohol use, unspecified, uncomplicated: Secondary | ICD-10-CM | POA: Insufficient documentation

## 2024-04-26 DIAGNOSIS — E119 Type 2 diabetes mellitus without complications: Secondary | ICD-10-CM | POA: Insufficient documentation

## 2024-04-26 DIAGNOSIS — I639 Cerebral infarction, unspecified: Secondary | ICD-10-CM | POA: Diagnosis not present

## 2024-04-26 DIAGNOSIS — Z87891 Personal history of nicotine dependence: Secondary | ICD-10-CM | POA: Insufficient documentation

## 2024-04-26 DIAGNOSIS — N179 Acute kidney failure, unspecified: Secondary | ICD-10-CM | POA: Insufficient documentation

## 2024-04-26 DIAGNOSIS — R42 Dizziness and giddiness: Secondary | ICD-10-CM

## 2024-04-26 DIAGNOSIS — G4700 Insomnia, unspecified: Secondary | ICD-10-CM | POA: Insufficient documentation

## 2024-04-26 DIAGNOSIS — Z791 Long term (current) use of non-steroidal anti-inflammatories (NSAID): Secondary | ICD-10-CM | POA: Insufficient documentation

## 2024-04-26 DIAGNOSIS — I10 Essential (primary) hypertension: Secondary | ICD-10-CM | POA: Insufficient documentation

## 2024-04-26 DIAGNOSIS — Z789 Other specified health status: Secondary | ICD-10-CM

## 2024-04-26 DIAGNOSIS — E785 Hyperlipidemia, unspecified: Secondary | ICD-10-CM | POA: Insufficient documentation

## 2024-04-26 LAB — CBC WITH DIFFERENTIAL/PLATELET
Abs Immature Granulocytes: 0.08 K/uL — ABNORMAL HIGH (ref 0.00–0.07)
Basophils Absolute: 0 K/uL (ref 0.0–0.1)
Basophils Relative: 0 %
Eosinophils Absolute: 0 K/uL (ref 0.0–0.5)
Eosinophils Relative: 0 %
HCT: 43.6 % (ref 39.0–52.0)
Hemoglobin: 14.8 g/dL (ref 13.0–17.0)
Immature Granulocytes: 1 %
Lymphocytes Relative: 3 %
Lymphs Abs: 0.3 K/uL — ABNORMAL LOW (ref 0.7–4.0)
MCH: 31.2 pg (ref 26.0–34.0)
MCHC: 33.9 g/dL (ref 30.0–36.0)
MCV: 92 fL (ref 80.0–100.0)
Monocytes Absolute: 0.2 K/uL (ref 0.1–1.0)
Monocytes Relative: 2 %
Neutro Abs: 9.5 K/uL — ABNORMAL HIGH (ref 1.7–7.7)
Neutrophils Relative %: 94 %
Platelets: 156 K/uL (ref 150–400)
RBC: 4.74 MIL/uL (ref 4.22–5.81)
RDW: 12.3 % (ref 11.5–15.5)
WBC: 10.1 K/uL (ref 4.0–10.5)
nRBC: 0 % (ref 0.0–0.2)

## 2024-04-26 LAB — COMPREHENSIVE METABOLIC PANEL WITH GFR
ALT: 59 U/L — ABNORMAL HIGH (ref 0–44)
AST: 64 U/L — ABNORMAL HIGH (ref 15–41)
Albumin: 3.8 g/dL (ref 3.5–5.0)
Alkaline Phosphatase: 80 U/L (ref 38–126)
Anion gap: 12 (ref 5–15)
BUN: 19 mg/dL (ref 8–23)
CO2: 25 mmol/L (ref 22–32)
Calcium: 8.7 mg/dL — ABNORMAL LOW (ref 8.9–10.3)
Chloride: 101 mmol/L (ref 98–111)
Creatinine, Ser: 1.58 mg/dL — ABNORMAL HIGH (ref 0.61–1.24)
GFR, Estimated: 47 mL/min — ABNORMAL LOW (ref >60)
Glucose, Bld: 220 mg/dL — ABNORMAL HIGH (ref 70–99)
Potassium: 3.8 mmol/L (ref 3.5–5.1)
Sodium: 138 mmol/L (ref 135–145)
Total Bilirubin: 1.1 mg/dL (ref 0.0–1.2)
Total Protein: 6.4 g/dL — ABNORMAL LOW (ref 6.5–8.1)

## 2024-04-26 LAB — GLUCOSE, CAPILLARY
Glucose-Capillary: 91 mg/dL (ref 70–99)
Glucose-Capillary: 166 mg/dL — ABNORMAL HIGH (ref 70–99)

## 2024-04-26 LAB — URINALYSIS, ROUTINE W REFLEX MICROSCOPIC
Bilirubin Urine: NEGATIVE
Glucose, UA: NEGATIVE mg/dL
Hgb urine dipstick: NEGATIVE
Ketones, ur: NEGATIVE mg/dL
Leukocytes,Ua: NEGATIVE
Nitrite: NEGATIVE
Protein, ur: 100 mg/dL — AB
Specific Gravity, Urine: 1.02 (ref 1.005–1.030)
pH: 5.5 (ref 5.0–8.0)

## 2024-04-26 LAB — LIPID PANEL
Cholesterol: 124 mg/dL (ref 0–200)
HDL: 36 mg/dL — ABNORMAL LOW (ref >40)
LDL Cholesterol: 64 mg/dL (ref 0–99)
Total CHOL/HDL Ratio: 3.4 ratio
Triglycerides: 120 mg/dL (ref <150)
VLDL: 24 mg/dL (ref 0–40)

## 2024-04-26 LAB — APTT: aPTT: 39 s — ABNORMAL HIGH (ref 24–36)

## 2024-04-26 LAB — URINALYSIS, MICROSCOPIC (REFLEX)

## 2024-04-26 LAB — AMMONIA: Ammonia: 15 umol/L (ref 9–35)

## 2024-04-26 LAB — PROTIME-INR
INR: 1.5 — ABNORMAL HIGH (ref 0.8–1.2)
Prothrombin Time: 18.6 s — ABNORMAL HIGH (ref 11.4–15.2)

## 2024-04-26 LAB — TSH: TSH: 2.01 u[IU]/mL (ref 0.350–4.500)

## 2024-04-26 MED ORDER — ACETAMINOPHEN 325 MG PO TABS
650.0000 mg | ORAL_TABLET | Freq: Once | ORAL | Status: AC
  Administered 2024-04-26: 650 mg via ORAL
  Filled 2024-04-26: qty 2

## 2024-04-26 MED ORDER — IOHEXOL 350 MG/ML SOLN
75.0000 mL | Freq: Once | INTRAVENOUS | Status: AC | PRN
  Administered 2024-04-26: 75 mL via INTRAVENOUS

## 2024-04-26 MED ORDER — ACETAMINOPHEN 650 MG RE SUPP: 650.0000 mg | Freq: Four times a day (QID) | RECTAL | Status: DC | PRN

## 2024-04-26 MED ORDER — ATORVASTATIN CALCIUM 40 MG PO TABS
40.0000 mg | ORAL_TABLET | Freq: Every day | ORAL | Status: DC
  Administered 2024-04-27: 40 mg via ORAL
  Filled 2024-04-26: qty 1

## 2024-04-26 MED ORDER — OXYCODONE HCL 5 MG PO TABS
5.0000 mg | ORAL_TABLET | Freq: Once | ORAL | Status: AC | PRN
  Administered 2024-04-26: 5 mg via ORAL
  Filled 2024-04-26: qty 1

## 2024-04-26 MED ORDER — TRAMADOL HCL 50 MG PO TABS
50.0000 mg | ORAL_TABLET | Freq: Once | ORAL | Status: AC
  Administered 2024-04-26: 50 mg via ORAL
  Filled 2024-04-26: qty 1

## 2024-04-26 MED ORDER — ACETAMINOPHEN 325 MG PO TABS
650.0000 mg | ORAL_TABLET | Freq: Four times a day (QID) | ORAL | Status: DC | PRN
  Administered 2024-04-26: 650 mg via ORAL
  Filled 2024-04-26: qty 2

## 2024-04-26 MED ORDER — METOCLOPRAMIDE HCL 5 MG/ML IJ SOLN
INTRAMUSCULAR | Status: AC
  Filled 2024-04-26: qty 2

## 2024-04-26 MED ORDER — INSULIN ASPART 100 UNIT/ML IJ SOLN
0.0000 [IU] | Freq: Three times a day (TID) | INTRAMUSCULAR | Status: DC
  Administered 2024-04-27: 2 [IU] via SUBCUTANEOUS
  Filled 2024-04-26: qty 2

## 2024-04-26 MED ORDER — PROCHLORPERAZINE EDISYLATE 10 MG/2ML IJ SOLN
10.0000 mg | Freq: Once | INTRAMUSCULAR | Status: AC
  Administered 2024-04-26: 10 mg via INTRAVENOUS
  Filled 2024-04-26: qty 2

## 2024-04-26 MED ORDER — SODIUM CHLORIDE 0.9 % IV BOLUS
1000.0000 mL | Freq: Once | INTRAVENOUS | Status: AC
  Administered 2024-04-26: 1000 mL via INTRAVENOUS

## 2024-04-26 MED ORDER — APIXABAN 5 MG PO TABS
5.0000 mg | ORAL_TABLET | Freq: Two times a day (BID) | ORAL | Status: DC
  Administered 2024-04-26 – 2024-04-27 (×2): 5 mg via ORAL
  Filled 2024-04-26 (×2): qty 1

## 2024-04-26 MED ORDER — METOCLOPRAMIDE HCL 5 MG/ML IJ SOLN
10.0000 mg | Freq: Once | INTRAMUSCULAR | Status: AC
  Administered 2024-04-26: 10 mg via INTRAVENOUS
  Filled 2024-04-26: qty 2

## 2024-04-26 MED ORDER — DIPHENHYDRAMINE HCL 50 MG/ML IJ SOLN
50.0000 mg | Freq: Once | INTRAMUSCULAR | Status: AC
  Administered 2024-04-26: 50 mg via INTRAVENOUS
  Filled 2024-04-26: qty 1

## 2024-04-26 MED ORDER — METOCLOPRAMIDE HCL 5 MG/ML IJ SOLN
10.0000 mg | Freq: Once | INTRAMUSCULAR | Status: AC
  Administered 2024-04-26: 10 mg via INTRAVENOUS

## 2024-04-26 MED ORDER — CARVEDILOL 12.5 MG PO TABS
12.5000 mg | ORAL_TABLET | Freq: Two times a day (BID) | ORAL | Status: DC
  Administered 2024-04-26 – 2024-04-27 (×2): 12.5 mg via ORAL
  Filled 2024-04-26 (×2): qty 1

## 2024-04-26 MED ORDER — DIPHENHYDRAMINE HCL 50 MG/ML IJ SOLN
25.0000 mg | Freq: Once | INTRAMUSCULAR | Status: AC
  Administered 2024-04-26: 25 mg via INTRAVENOUS
  Filled 2024-04-26: qty 1

## 2024-04-26 NOTE — H&P (Addendum)
 Hospital Admission History and Physical Service Pager: [phone]  Patient name: Peter Robinson Medical record number: [id number] Date of Birth: [birth year] Age: 68 y.o. Gender: male  Primary Care Provider: Miquel Blunt Consultants: Neurology Code Status: Full Preferred Emergency Contact: Sister Wanna Gaskins)  Contact Information     Name Relation Home Work Mobile   Clark,Meg Sister   [phone]      Other Contacts     Name Relation Home Work Mobile   Lewis,Sally Significant other   [phone]        Chief Complaint: dizzy  Differential and Medical Decision Making:  Peter Robinson is a 68 y.o. male with PMH of CVA, afib,  HLD, HTN, T2DM who presents with acute, intermittent dizziness, aphasia, and tunnel vision with MRI findings of multiple acute embolic cerebellar strokes. Potential etiology for acute strokes include: Eliquis  failure: He has been compliant with Eliquis  since his first stroke in October 2024 which was attributed to eliquis  noncompliance. Hypercoagulability: given report of decreased appetite and recurrent stroke despite anticoagulation, will evaluate for malignancy. Also, he has an AKI with proteinuria but normal albumin and platelets, making nephrotic syndrome less likely as source of hypercoagulability.  Cardiac vegetation: Given normal echo in October after patient's first stroke, I think this is much less likely. Additionally, he does not have fevers or reported history of IV drug use that would indicate infectious endocarditis.  Assessment & Plan Dizziness Cerebrovascular accident (CVA), unspecified mechanism (HCC) - Admit to FMTS, Pray - telemetry - Neurology following, appreciate recs  - Neuro checks per stroke protocol  - Lipid panel  - HgbA1C  - SBP <180, gradual normotension  - PT/OT/SLP  - CT chest/abdomen/pelvis with contrast to evaluate for potential malignancy - continue lipitor - continue eliquis   - AM CBC Persistent atrial  fibrillation (HCC) Afib with RVR when assessed at bedside, though appropriate HR (<110) documented in chart thus far. Will continue to monitor rates with telemetry, but may need improvement of rate control if RVR persists.  - telemetry - continue eliquis   - continue coreg  12.5 BID - echo in October normal, will not repeat at this time AKI (acute kidney injury) Baseline creatine ~1.15 (based on two labs in the last year). Elevated at 1.58 today with proteinuria. Albumin is normal. Eval for hypercoagulability due to possible nephrotic syndrome.  - avoid nephrotoxic agents - AM BMP  - PT/INR Chronic health problem T2DM: hold home glipizide, metformin. Start moderate SSI, CBG ACHS; will adjust regimen based on glucose.  HTN: hold amlodipine , doxazosin, lasix , losartan . Will gradually restart for SBP <180, per neuro.  HLD: lipitor as above Insomnia: hold Gabapentin  1200 mg at bedtime while acutely dizzy  FEN/GI: regular VTE Prophylaxis: eliquis   Disposition: med-surg  History of Present Illness:  Peter Robinson is a 68 y.o. male presenting with dizziness in the last week. He had his first stroke on March 19, 2024 with gradual improvement and was feeling normal until about 1 wk ago (04/17/24), when he started to feel dizzy. The dizzy would come and go, some days were fine and others would be very dizzy. Having tunnel vision that comes and goes. Also having generalized weakness, no specific weakness in a body distribution. Also reports chills this past week. Also feels like he had a cold for this past week too, family members also having scratchy throats around that time. No documented fevers. No congestion, cough, nausea, vomiting, diarrhea. Reports his speech has been getting better, but he has some days  where he feels like it gets worse again. Having poor appetite.Denies vomiting, diarrhea. Last BM this AM. Denies vision changes. Denies LOC but did feel close to blacking out. Reports compliance  with eliquis .    In the ED, tachycardic with irregular rhythm. CBC unremarkable. EKG with a fib. CT head without acute findings. He was transferred to [facility] ED at that time for further MRI brain and CTA head/neack. Neurology was consulted and recommended admission for further stroke evaluation given compliance with eliquis  after last stroke (attributed to a fib).    Review Of Systems: Per HPI with the following additions: + chills, intermittent aphasia, No fevers. No congestion, cough, nausea, vomiting, diarrhea. No vision changes  Pertinent Past Medical History: Atrial fibrillation CVA HLD T2DM HTN  Remainder reviewed in history tab.   Pertinent Past Surgical History: Remainder reviewed in history tab.   Pertinent Social History: Lives alone.Smoking since teenage years, stopped about 3-4 yrs ago. Social use of alcohol. Denies other drug use.   Pertinent Family History: Reviewed in history tab  Important Outpatient Medications: Amlodipine  2.5 mg daily Lipitor 40 mg daily Coreg  12.5 mg daily Doxazosin 2 mg daily Eliquis  5 mg BID Lasix  20 mg daily Gabapentin  1200 mg at bedtime Glipizide 5 mg daily Lantus 20-25 units daily Losartan  100 mg daily Metformin 500 mg daily  Objective: BP (!) 145/95 (BP Location: Right Arm)   Pulse (!) 104   Temp 98.2 F (36.8 C) (Oral)   Resp 19   Ht 6' (1.829 m)   Wt 93 kg   SpO2 97%   BMI 27.80 kg/m  Exam: General: well appearing male lying under blanket in bed Neuro: no cranial nerve deficits. Normal finger to nose testing. No pronator drift. 5/5 UE strength. 4+/5 SLR bilaterally. 5/5 dorsiflexion, plantarflexion Cardiovascular: tachycardic, irregular rhythm, 2+ radial pulses bilaterally Respiratory: CTAB, no increased WOB Abd: soft, nontender, obese  Labs:  CBC BMET  Recent Labs  Lab 04/26/24 0629  WBC 10.1  HGB 14.8  HCT 43.6  PLT 156   Recent Labs  Lab 04/26/24 0629  NA 138  K 3.8  CL 101  CO2 25  BUN 19   CREATININE 1.58*  GLUCOSE 220*  CALCIUM  8.7*      Ammonia 15 TSH 2.010 AST/ALT 64/59 UA 100 protein  EKG: irregularly irregular and tachycardic   Imaging Studies Performed:  CT ANGIO HEAD NECK W WO CM Result Date: 04/26/2024 EXAM: CTA HEAD AND NECK WITH AND WITHOUT TECHNIQUE: CTA of the head and neck was performed with and without the administration of intravenous contrast. The contrast dosage was 75mL (iohexol  (OMNIPAQUE ) 350 MG/ML injection 75 mL IOHEXOL  350 MG/ML SOLN). Multiplanar 2D and/or 3D reformatted images are provided for review. Automated exposure control, iterative reconstruction, and/or weight-based adjustment of the mA/kV was utilized to reduce the radiation dose to as low as reasonably achievable. Stenosis of the internal carotid arteries measured using NASCET criteria. COMPARISON: CTA head and neck 03/18/24 CLINICAL HISTORY: Acute stroke on MRI FINDINGS: CTA NECK: AORTIC ARCH AND ARCH VESSELS: Mild atherosclerotic calcification at the origins of both subclavian arteries without evidence of a significant stenosis. CERVICAL CAROTID ARTERIES: Small amount of mixed plaque about both carotid bifurcations. No evidence of a significant stenosis or dissection. CERVICAL VERTEBRAL ARTERIES: Dominant left vertebral artery. No evidence of a significant stenosis or dissection. BONES: Moderate multilevel lower cervical disc degeneration. Focally severe right facet arthrosis at C3-C4. LUNGS AND MEDIASTINUM: Unremarkable. SOFT TISSUES: No acute abnormality. CTA HEAD: ANTERIOR  CIRCULATION: The internal carotid arteries are patent from skull base to carotid termini with atherosclerosis not resulting in a significant stenosis. The ACAs and MCAs are patent without evidence of a proximal branch occlusion or significant A1 or M1 stenosis. There is a severe proximal left M3 branch stenosis corresponding to the location of the branch occlusion on the prior CTA. No aneurysm. POSTERIOR CIRCULATION: The  intracranial vertebral arteries are patent to the basilar with mild atherosclerosis of the left V4 segment not resulting in significant stenosis. Patent right PICA, bilateral AICA, and bilateral SCA origins are visualized. The basilar artery is widely patent. There is a fetal origin of the right PCA. The PCAs are patent with branch vessel irregularity but no evidence of a flow-limiting proximal stenosis. OTHER: No dural venous sinus thrombosis on this non-dedicated study. IMPRESSION: 1. No large vessel occlusion. 2. 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 3. Mild carotid atherosclerosis without significant stenosis. Electronically signed by: Dasie Hamburg MD 04/26/2024 11:27 AM EST RP Workstation: [id number]   MR BRAIN WO CONTRAST Result Date: 04/26/2024 EXAM: MRI BRAIN WITHOUT CONTRAST 04/26/2024 09:37:50 AM TECHNIQUE: Multiplanar multisequence MRI of the head/brain was performed without the administration of intravenous contrast. COMPARISON: None available. CLINICAL HISTORY: Syncope/presyncope, cerebrovascular cause suspected. FINDINGS: BRAIN AND VENTRICLES: There are several foci of restricted diffusion present within the right cerebellar hemisphere. There is also a focus of restricted diffusion in the right cerebellar vermis and medially within the left cerebellar hemisphere. There is questionable residual restricted diffusion present within the left parietal lobe at the site of the previously noted acute infarct. There is age-related atrophy and moderately advanced diffuse cerebral white matter disease. No intracranial hemorrhage. No mass. No midline shift. No hydrocephalus. The sella is unremarkable. Normal flow voids. ORBITS: No acute abnormality. SINUSES AND MASTOIDS: No acute abnormality. BONES AND SOFT TISSUES: Normal marrow signal. No acute soft tissue abnormality. IMPRESSION: 1. Acute infarcts in the right cerebellar hemisphere, right cerebellar vermis, and medially within the left  cerebellar hemisphere. 2. Questionable residual restricted diffusion in the left parietal lobe at the site of a previously noted acute infarct. 3. Moderately advanced diffuse cerebral white matter disease. Electronically signed by: Evalene Coho MD 04/26/2024 10:13 AM EST RP Workstation: [id number]   CT Head Wo Contrast Result Date: 04/26/2024 EXAM: CT HEAD WITHOUT CONTRAST 04/26/2024 06:57:00 AM TECHNIQUE: CT of the head was performed without the administration of intravenous contrast. Automated exposure control, iterative reconstruction, and/or weight based adjustment of the mA/kV was utilized to reduce the radiation dose to as low as reasonably achievable. COMPARISON: MRI brain 03/19/2024 and CT head 03/18/2024. CLINICAL HISTORY: Syncope/presyncope, cerebrovascular cause suspected. FINDINGS: BRAIN AND VENTRICLES: No acute hemorrhage. No cortical-based acute infarct. No mass effect or midline shift. No extra-axial collection. There are small chronic infarcts in the left parietal lobe which were previously evolving. There is mild atrophy and atrophic ventriculomegaly with moderately advanced small vessel disease of the cerebral white matter. There is patchy calcification in the siphons without hyperdense vessel. The basal cisterns are clear. ORBITS: There are old lens replacements with no acute orbital findings. SINUSES: There is membrane thickening in the ethmoid air cells. Other paranasal sinuses, bilateral mastoid air cells and middle ears are clear. The nasal septum is S-shaped. SOFT TISSUES AND SKULL: No acute soft tissue abnormality. No skull fracture. IMPRESSION: 1. No acute intracranial CT findings. 2. Small chronic infarcts in the left parietal lobe, previously evolving. 3. Atrophy and small vessel changes. Carotid atherosclerosis. Electronically signed  by: Francis Quam MD 04/26/2024 07:25 AM EST RP Workstation: [id number]      Alena Morrison, Reagan, MD 04/26/2024, 1:29 PM PGY-1, [facility] Family Medicine  FPTS Intern pager: [phone], text pages welcome Secure chat group [facility] Teaching Service

## 2024-04-26 NOTE — Plan of Care (Signed)
 FMTS Interim Progress Note  S: Evaluated patient w/ Dr Tharon. No acute symptoms this evening, reports feeling much better than earlier in the day. Discussed his case overall. He describes about a week of dizziness, loss of power and trouble making it to the bathroom this AM.   RN Scoggins reported at 22:10 that patient was becoming anxious and agitated, though reports no change in mental status or physical condition. HA 7/10 not responsive to 650mg  of tylenol .   O: BP (!) 150/102 (BP Location: Right Arm)   Pulse (!) 112   Temp 99 F (37.2 C) (Oral)   Resp 18   Ht 6' (1.829 m)   Wt 92.5 kg   SpO2 94%   BMI 27.66 kg/m   General: NAD, well appearing Neuro: A&O Respiratory: normal WOB on RA Extremities: Moving all 4 extremities equally  A/P:  CVA NAD on exam, patient borderline tachycardic and hypertensive but otherwise VSS.  Continue plan per day team  Headache Metoclopramide 10mg  IV Diphenhydramine 50mg  IV Tylenol  650mg  PO  Manon Jester, DO 04/26/2024, 10:16 PM PGY-1, [facility] Family Medicine Service pager [phone]

## 2024-04-26 NOTE — ED Notes (Signed)
 Pt in bed, sig other at bedside, pt states that he was sent from [place] for a MRI, pt reports headache and dizziness, pt moving all extremities, equal sensation to touch.

## 2024-04-26 NOTE — ED Notes (Addendum)
 EDP [place] at [facility]. Pt verbalizes worse dizziness since leaving [facility]. Residual speech deficit/ dysarthria since CVA.

## 2024-04-26 NOTE — ED Triage Notes (Signed)
 Patient transferred from [facility] for MRI

## 2024-04-26 NOTE — ED Triage Notes (Addendum)
 Patient here POV from Home.  Patient notes Dizziness that began 1 week ago. Waxes and wanes but has been constant for 1 week. States he got OOB 8 days ago and was out of balance. No Pain. Notes also loss of power.   Residual speech deficits from previous CVA in Late October. Provider at the bedside.   NAD noted during Triage. A&Ox4. GCS 15. BIB Wheelchair.

## 2024-04-26 NOTE — Assessment & Plan Note (Addendum)
-   Admit to FMTS, Pray - telemetry - Neurology following, appreciate recs  - Neuro checks per stroke protocol  - Lipid panel  - HgbA1C  - SBP <180, gradual normotension  - PT/OT/SLP  - CT chest/abdomen/pelvis with contrast to evaluate for potential malignancy - continue lipitor - continue eliquis   - AM CBC

## 2024-04-26 NOTE — ED Provider Notes (Signed)
 Patient transferred from [facility] for MRI to further evaluate dizziness troubles he has been having.  Anticipate reassessment after MRI completion.  11:18 AM MRI does show evidence of acute stroke.  I spoke to neurology who saw the patient and feels as he has been compliant with his Eliquis  he will need admission for further stroke workup.  We will get the CTA he also wanted and will call medicine for admission.  Initially it was paged out to hospitalist team but we will now call unassigned.  He wanted some Tylenol  for headache which I ordered.  Clinical Impression: 1. Persistent atrial fibrillation (HCC)   2. Dizziness   3. Cerebrovascular accident (CVA), unspecified mechanism (HCC)     Disposition: Admit  This note was prepared with assistance of Dragon voice recognition software. Occasional wrong-word or sound-a-like substitutions may have occurred due to the inherent limitations of voice recognition software.     Kassidy Frankson, Lonni PARAS, MD 04/26/24 1213

## 2024-04-26 NOTE — ED Provider Notes (Signed)
 [place] [facility] Provider Note   CSN: [id number] Arrival date & time: 04/26/24  [id number]     Patient presents with: Dizziness   Peter Robinson is a 68 y.o. male.   The history is provided by the patient and the spouse.  Dizziness Quality:  Vertigo Severity:  Moderate Onset quality:  Gradual Duration:  1 week Timing:  Constant Progression:  Waxing and waning Chronicity:  New Context: not when urinating   Context comment:  Had a stroke one month ago and is on Eliquis . Was scheduled to see neurology this am for post stroke follow up. Relieved by:  Nothing Worsened by:  Nothing Ineffective treatments:  None tried Associated symptoms: no nausea, no tinnitus and no vomiting   Risk factors: hx of stroke   Risk factors: no hx of vertigo   Patient one month post CVA on ELiquis  for permanent AFIB presents with one week of constant dizziness and feeling off balance. Has persistent speech deficits and is currently in neuro rehab.      Past Medical History:  Diagnosis Date   Atrial fibrillation (HCC)    Diabetes mellitus without complication (HCC)    Hypertension      Prior to Admission medications   Medication Sig Start Date End Date Taking? Authorizing Provider  amLODipine  (NORVASC ) 2.5 MG tablet Take 2.5 mg by mouth daily. 12/28/21   [provider]  atorvastatin  (LIPITOR) 40 MG tablet Take 40 mg by mouth daily. 11/21/21   [provider]  carvedilol  (COREG ) 12.5 MG tablet Take 12.5 mg by mouth 2 (two) times daily. 11/21/21   [provider]  diclofenac (VOLTAREN) 75 MG EC tablet Take 75 mg by mouth 2 (two) times daily as needed for mild pain (pain score 1-3) (playing golf). 03/17/24   [provider]  doxazosin (CARDURA) 2 MG tablet Take 2 mg by mouth daily. 11/21/21   [provider]  ELIQUIS  5 MG TABS tablet Take 1 tablet (5 mg total) by mouth 2 (two) times daily. 03/19/24   Briana Elgin LABOR, MD   furosemide  (LASIX ) 20 MG tablet Take 20 mg by mouth daily. 01/23/22   [provider]  gabapentin  (NEURONTIN ) 600 MG tablet Take 1,200 mg by mouth at bedtime. 01/27/24   [provider]  glipiZIDE (GLUCOTROL XL) 5 MG 24 hr tablet Take 5 mg by mouth daily with breakfast. 12/28/21   [provider]  LANTUS SOLOSTAR 100 UNIT/ML Solostar Pen Inject 20-22 Units into the skin daily. 01/12/24   [provider]  losartan  (COZAAR ) 100 MG tablet Take 100 mg by mouth daily. 11/21/21   [provider]  metFORMIN (GLUCOPHAGE-XR) 500 MG 24 hr tablet Take 500 mg by mouth daily. 11/30/21   [provider]  Multiple Vitamin (MULTIVITAMIN WITH MINERALS) TABS tablet Take 1 tablet by mouth daily.    [provider]  predniSONE (DELTASONE) 20 MG tablet Take 20 mg by mouth daily as needed (for playing golf). 01/11/22   [provider]    Allergies: Patient has no known allergies.    Review of Systems  Constitutional:  Negative for fever.  HENT:  Negative for tinnitus.   Gastrointestinal:  Negative for nausea and vomiting.  Neurological:  Positive for dizziness. Negative for numbness.  All other systems reviewed and are negative.   Updated Vital Signs BP (!) 131/98   Pulse (!) 111   Temp 98.4 F (36.9 C)   Resp 18   Ht  6' (1.829 m)   Wt 93 kg   SpO2 95%   BMI 27.80 kg/m   Physical Exam Vitals and nursing note reviewed. Exam conducted with a chaperone present.  Constitutional:      General: He is not in acute distress.    Appearance: Normal appearance. He is well-developed. He is not diaphoretic.  HENT:     Head: Normocephalic and atraumatic.     Nose: Nose normal.     Mouth/Throat:     Mouth: Mucous membranes are moist.     Pharynx: Oropharynx is clear.  Eyes:     Extraocular Movements: Extraocular movements intact.     Conjunctiva/sclera: Conjunctivae normal.     Pupils: Pupils are equal, round, and reactive to light.   Cardiovascular:     Rate and Rhythm: Tachycardia present. Rhythm irregular.     Pulses: Normal pulses.     Heart sounds: Normal heart sounds.  Pulmonary:     Effort: Pulmonary effort is normal. No respiratory distress.     Breath sounds: Normal breath sounds. No wheezing or rales.  Abdominal:     General: Bowel sounds are normal.     Palpations: Abdomen is soft.     Tenderness: There is no abdominal tenderness. There is no guarding or rebound.  Musculoskeletal:        General: Normal range of motion.     Cervical back: Normal range of motion and neck supple.  Skin:    General: Skin is warm and dry.     Capillary Refill: Capillary refill takes less than 2 seconds.  Neurological:     General: No focal deficit present.     Mental Status: He is alert and oriented to person, place, and time.     Sensory: No sensory deficit.     Motor: No weakness.     Deep Tendon Reflexes: Reflexes normal.     Comments: Speech is goal directed but with occasional pauses      (all labs ordered are listed, but only abnormal results are displayed) Results for orders placed or performed during the hospital encounter of 04/26/24  CBC with Differential   Collection Time: 04/26/24  6:29 AM  Result Value Ref Range   WBC 10.1 4.0 - 10.5 K/uL   RBC 4.74 4.22 - 5.81 MIL/uL   Hemoglobin 14.8 13.0 - 17.0 g/dL   HCT 56.3 60.9 - 47.9 %   MCV 92.0 80.0 - 100.0 fL   MCH 31.2 26.0 - 34.0 pg   MCHC 33.9 30.0 - 36.0 g/dL   RDW 87.6 88.4 - 84.4 %   Platelets 156 150 - 400 K/uL   nRBC 0.0 0.0 - 0.2 %   Neutrophils Relative % 94 %   Neutro Abs 9.5 (H) 1.7 - 7.7 K/uL   Lymphocytes Relative 3 %   Lymphs Abs 0.3 (L) 0.7 - 4.0 K/uL   Monocytes Relative 2 %   Monocytes Absolute 0.2 0.1 - 1.0 K/uL   Eosinophils Relative 0 %   Eosinophils Absolute 0.0 0.0 - 0.5 K/uL   Basophils Relative 0 %   Basophils Absolute 0.0 0.0 - 0.1 K/uL   Immature Granulocytes 1 %   Abs Immature Granulocytes 0.08 (H) 0.00 - 0.07 K/uL    No results found.  EKG: EKG Interpretation Date/Time:  Monday April 26 2024 06:22:45 EST Ventricular Rate:  105 PR Interval:    QRS Duration:  101 QT Interval:  352 QTC Calculation: 466 R Axis:   100  Text Interpretation: Atrial fibrillation Confirmed by Kamyah Wilhelmsen ([id number]) on 04/26/2024 6:28:55 AM  Radiology: No results found.   Procedures   Medications Ordered in the ED - No data to display                                  Medical Decision Making Presents with 1 week of constant dizziness and feeling off balance.  Still has speech deficits, is in neuro rehab was scheduled to see [place] neurology this am   Amount and/or Complexity of Data Reviewed Independent Historian: spouse    Details: See above  External Data Reviewed: labs, radiology and notes.    Details: Previous notes reviewed, previous admission for CVA reviewed.  MRI reviewed  Labs: ordered.    Details: Normal white count 101. Normal hemoglobin 14.8,, normal platelets  Radiology: ordered.    Details: MRI ordered   ECG/medicine tests: ordered and independent interpretation performed. Decision-making details documented in ED Course. Discussion of management or test interpretation with external provider(s): 6:51 AM Case d/w Dr. Arora.  CT head without contrast and then ED to ED for MRI.  No goal directed therapy but if positive MRI for CVA then admit for change of anticoagulation.   DrRONITA Jerrol Accepts    Risk Risk Details: This is NOT a code stroke as symptoms are ongoing for 1 week.  2. Patient is on Eliquis  and is compliant.  3. Patient had a stroke one month ago.  Patient will need CTA head and neck and likely MRI of the brain.   MRI ordered.  Patient will go ED to ED for MRI of the brain      Final diagnoses:  Dizziness   Signed out to Dr. Darra pending transfer.   ED Discharge Orders     None          Nadirah Socorro, MD 04/26/24 [phone]

## 2024-04-26 NOTE — Plan of Care (Incomplete)
 FMTS Interim Progress Note  S:***  O: BP (!) 150/102 (BP Location: Right Arm)   Pulse (!) 112   Temp 99 F (37.2 C) (Oral)   Resp 18   Ht 6' (1.829 m)   Wt 92.5 kg   SpO2 94%   BMI 27.66 kg/m     A/P: PIERRETTE Manon Jester, DO 04/26/2024, 8:49 PM PGY-1, [facility] pager [phone]

## 2024-04-26 NOTE — ED Notes (Signed)
 Transferred from [facility] by POV with spouse for MRI. Endorses dizziness, HA, nausea, unsteady on feet, and severe fatigue/ no energy. Describes as comes and goes over last week. Have perfect days and the worst days. Takes eliquis  for afib. Developed CVA recently 1 month ago when he stopped his eliquis . Has resumed his eliquis  since CVA.  Denies other pain, fever, VD, falls. Rates HA 7/10. Alert, NAD, calm, interactive, resps e/u, speaking in clear complete sentences. Skin W&D.

## 2024-04-26 NOTE — Plan of Care (Signed)

## 2024-04-26 NOTE — ED Provider Notes (Signed)
 Blood pressure (!) 131/98, pulse (!) 111, temperature 98.4 F (36.9 C), resp. rate 18, height 6' (1.829 m), weight 93 kg, SpO2 95%.  Assuming care from Dr. Palumbo.  In short, Peter Robinson is a 68 y.o. male with a chief complaint of Dizziness .  Refer to the original H&P for additional details.  The current plan of care is to follow up on CT head.  07:39 AM  CT head without acute findings. Plan for ED-ED transfer to [facility] for MRI. Patient has SO here at bedside to drive him directly to the ED at [facility]. Stable for transfer. Dr. Dreama is the accepting EDP.     Darra Fonda MATSU, MD 04/26/24 [phone]

## 2024-04-26 NOTE — Hospital Course (Signed)
 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  His hospital course is detailed below:  Stroke Patient presented to ED after 1 week of intermittent dizziness and tunnel vision.  In an outside ED, he was found to be tachycardic, in atrial fibrillation, with CT head without acute findings, but concern for stroke.  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  Patient reported compliance with twice daily Eliquis  since last stroke at the end of October 2025.  Neurology consulted for assistance with stroke management.  CT chest/abdomen/pelvis without evidence of malignancy.  Echocardiogram was obtained during October admission and was normal; therefore, repeat echocardiogram was not performed this admission.  Stroke thought to be secondary to Eliquis  failure.  Patient discharged with Pradaxa 150 mg twice daily and appointment with Dr. Kennyth, cardiologist, for outpatient evaluation for possible Watchman and ablation.  AKI Patient's creatinine elevated above baseline during this admission with proteinuria on microscopy.  Serum albumin and platelets not consistent with nephrotic syndrome.  Patient with subjective reports of normal urine output and hydration.  Nephrotoxic agents (lasix , losartan , diclofenac) held during this admission. FeUrea and PVR ordered, but patient wanting to discharge and otherwise medically stable for discharge prior to results and further treatment.  Recommend further follow-up outpatient.  T2DM Patient's glipizide, metformin and lantus 20 units  held this admission. CBG <180. Glipizide and metformin restarted at discharge, but lantus held. A1c in process at time of discharge, but October 2025 A1c 6.6  PCP recommendations: Cardiology appointment outpatient Follow up urine studies, repeat serum creatinine  Consider restarting lasix  and losartan  Review med rec as mutliple medications paused on discharge (prednisone PRN and lantus in addition to nephrotoxic agents)

## 2024-04-26 NOTE — Assessment & Plan Note (Signed)
 Afib with RVR when assessed at bedside, though appropriate HR (<110) documented in chart thus far. Will continue to monitor rates with telemetry, but may need improvement of rate control if RVR persists.  - telemetry - continue eliquis   - continue coreg  12.5 BID - echo in October normal, will not repeat at this time

## 2024-04-26 NOTE — Consult Note (Signed)
 NEUROLOGY CONSULT NOTE   Date of service: April 26, 2024 Patient Name: Peter Robinson MRN:  [id number] DOB:  04-Sep-1955 Chief Complaint: cerebellar strokes Requesting Provider: Ginger Lonni PARAS, *  History of Present Illness  Glendel Jaggers is a 68 y.o. male with hx of afibb on eliquis  and compliant now, former smoker who quit 3 years ago, hx of DM2, HTN, HLD who presents with intermittent vertigo, dysequilibrium for the last 10 days. It got worse last night and he felt like it was time to get this checked out.  Reports that in the past, he was missing his eliquis  and ended up being admitted for a L MCA stroke on oct 24th of this year. Reports, he religiously takes his eliquis  twice a day and has not missed a single dose.   Reports he has a 8/10 headache today.  Labs also concerning for an AKI.  LKW: 10 days ago Modified rankin score: 1-No significant post stroke disability and can perform usual duties with stroke symptoms IV Thrombolysis: not offered, outside window and on eliquis  EVT: not offered, no LVO noted and outside window  NIHSS components Score: Comment  1a Level of Conscious 0[x]  1[]  2[]  3[]      1b LOC Questions 0[x]  1[]  2[]       1c LOC Commands 0[x]  1[]  2[]       2 Best Gaze 0[x]  1[]  2[]       3 Visual 0[x]  1[]  2[]  3[]      4 Facial Palsy 0[x]  1[]  2[]  3[]      5a Motor Arm - left 0[x]  1[]  2[]  3[]  4[]  UN[]    5b Motor Arm - Right 0[x]  1[]  2[]  3[]  4[]  UN[]    6a Motor Leg - Left 0[x]  1[]  2[]  3[]  4[]  UN[]    6b Motor Leg - Right 0[x]  1[]  2[]  3[]  4[]  UN[]    7 Limb Ataxia 0[x]  1[]  2[]  UN[]      8 Sensory 0[x]  1[]  2[]  UN[]      9 Best Language 0[x]  1[]  2[]  3[]      10 Dysarthria 0[x]  1[]  2[]  UN[]      11 Extinct. and Inattention 0[x]  1[]  2[]       TOTAL: 0      ROS  Comprehensive ROS performed and pertinent positives documented in HPI   Past History   Past Medical History:  Diagnosis Date   Atrial fibrillation (HCC)    Diabetes mellitus without complication (HCC)     Hypertension     Past Surgical History:  Procedure Laterality Date   CATARACT EXTRACTION Bilateral     Family History: History reviewed. No pertinent family history.  Social History  reports that he has quit smoking. His smoking use included cigarettes. He has never used smokeless tobacco. He reports current alcohol use. He reports that he does not use drugs.  No Known Allergies  Medications  No current facility-administered medications for this encounter.  Current Outpatient Medications:    amLODipine  (NORVASC ) 2.5 MG tablet, Take 2.5 mg by mouth daily., Disp: , Rfl:    atorvastatin  (LIPITOR) 40 MG tablet, Take 40 mg by mouth daily., Disp: , Rfl:    carvedilol  (COREG ) 12.5 MG tablet, Take 12.5 mg by mouth 2 (two) times daily., Disp: , Rfl:    diclofenac (VOLTAREN) 75 MG EC tablet, Take 75 mg by mouth 2 (two) times daily as needed for mild pain (pain score 1-3) (playing golf)., Disp: , Rfl:    doxazosin (CARDURA) 2 MG tablet, Take 2 mg by mouth daily., Disp: , Rfl:  ELIQUIS  5 MG TABS tablet, Take 1 tablet (5 mg total) by mouth 2 (two) times daily., Disp: 180 tablet, Rfl: 0   furosemide  (LASIX ) 20 MG tablet, Take 20 mg by mouth daily., Disp: , Rfl:    gabapentin  (NEURONTIN ) 600 MG tablet, Take 1,200 mg by mouth at bedtime., Disp: , Rfl:    glipiZIDE (GLUCOTROL XL) 5 MG 24 hr tablet, Take 5 mg by mouth daily with breakfast., Disp: , Rfl:    LANTUS SOLOSTAR 100 UNIT/ML Solostar Pen, Inject 20-22 Units into the skin daily., Disp: , Rfl:    losartan  (COZAAR ) 100 MG tablet, Take 100 mg by mouth daily., Disp: , Rfl:    metFORMIN (GLUCOPHAGE-XR) 500 MG 24 hr tablet, Take 500 mg by mouth daily., Disp: , Rfl:    Multiple Vitamin (MULTIVITAMIN WITH MINERALS) TABS tablet, Take 1 tablet by mouth daily., Disp: , Rfl:    predniSONE (DELTASONE) 20 MG tablet, Take 20 mg by mouth daily as needed (for playing golf)., Disp: , Rfl:   Vitals   Vitals:   04/26/24 0730 04/26/24 0741 04/26/24 0745  04/26/24 0826  BP: (!) 136/92 (!) 136/92  (!) 133/95  Pulse:  98 97 (!) 105  Resp:  18  16  Temp:  98.4 F (36.9 C)  97.6 F (36.4 C)  TempSrc:  Oral    SpO2:  95% 93% 95%  Weight:      Height:        Body mass index is 27.8 kg/m.   Physical Exam   General: Laying comfortably in bed; in no acute distress.  HENT: Normal oropharynx and mucosa. Normal external appearance of ears and nose.  Neck: Supple, no pain or tenderness  CV: No JVD. No peripheral edema.  Pulmonary: Symmetric Chest rise. Normal respiratory effort.  Abdomen: Soft to touch, non-tender.  Ext: No cyanosis, edema, or deformity  Skin: No rash. Normal palpation of skin.   Musculoskeletal: Normal digits and nails by inspection. No clubbing.   Neurologic Examination  Mental status/Cognition: Alert, oriented to self, place, month and year, good attention.  Speech/language: Fluent, comprehension intact, object naming intact, repetition intact.  Cranial nerves:   CN II Pupils equal and reactive to light, no VF deficits    CN III,IV,VI EOM intact, no gaze preference or deviation, no nystagmus    CN V normal sensation in V1, V2, and V3 segments bilaterally    CN VII no asymmetry, no nasolabial fold flattening    CN VIII normal hearing to speech    CN IX & X normal palatal elevation, no uvular deviation    CN XI 5/5 head turn and 5/5 shoulder shrug bilaterally    CN XII midline tongue protrusion    Motor:  Muscle bulk: normal, tone normal, pronator drift none tremor none Mvmt Root Nerve  Muscle Right Left Comments  SA C5/6 Ax Deltoid 5 5   EF C5/6 Mc Biceps 5 5   EE C6/7/8 Rad Triceps 5 5   WF C6/7 Med FCR     WE C7/8 PIN ECU     F Ab C8/T1 U ADM/FDI 5 5   HF L1/2/3 Fem Illopsoas 4+ 4+   KE L2/3/4 Fem Quad 5 5   DF L4/5 D Peron Tib Ant 5 5   PF S1/2 Tibial Grc/Sol 5 5    Sensation:  Light touch Intact throughout   Pin prick    Temperature    Vibration   Proprioception    Coordination/Complex Motor:  -  Finger to Nose intact BL but slight end intention tremor noted in LUE - Heel to shin intact BL - Rapid alternating movement are slowed throughout but worse of the left compared to right - Gait: deferred.  Labs/Imaging/Neurodiagnostic studies   CBC:  Recent Labs  Lab 05/15/2024 0629  WBC 10.1  NEUTROABS 9.5*  HGB 14.8  HCT 43.6  MCV 92.0  PLT 156   Basic Metabolic Panel:  Lab Results  Component Value Date   NA 138 05-15-2024   K 3.8 May 15, 2024   CO2 25 May 15, 2024   GLUCOSE 220 (H) 05/15/24   BUN 19 05/15/2024   CREATININE 1.58 (H) 2024-05-15   CALCIUM  8.7 (L) May 15, 2024   GFRNONAA 47 (L) 05/15/24   Lipid Panel:  Lab Results  Component Value Date   LDLCALC 93 03/19/2024   HgbA1c:  Lab Results  Component Value Date   HGBA1C 6.6 (H) 03/19/2024   Urine Drug Screen: No results found for: LABOPIA, COCAINSCRNUR, LABBENZ, AMPHETMU, THCU, LABBARB  Alcohol Level No results found for: [facility] INR  Lab Results  Component Value Date   INR 1.0 03/19/2024   APTT  Lab Results  Component Value Date   APTT 28 03/18/2024   AED levels: No results found for: PHENYTOIN, ZONISAMIDE, LAMOTRIGINE, LEVETIRACETA  CT Head without contrast(Personally reviewed): CTH was negative for a large hypodensity concerning for a large territory infarct or hyperdensity concerning for an ICH  CT angio Head and Neck with contrast(Personally reviewed): No LVO, stable from prior in oct 2025.  MRI Brain(Personally reviewed): Embolic appearing stroke in BL cerebellum, right worse than left.  ASSESSMENT   Boykin Baetz is a 68 y.o. male with hx of afibb on eliquis  and compliant now, former smoker who quit 3 years ago, hx of DM2, HTN, HLD who presents with intermittent vertigo, dysequilibrium for the last 10 days. It got worse last night and he felt like it was time to get this checked out.  He was found to have BL cerebellar stroke, right worse than left. He feels off balance  and scared to walk. Strokes appear embolic in nature as it involves more than 1 vascular territory. Etiology of his strokes is unclear and concern for eliquis  failure.  He has not had a fall but is worried about it and this is a concern since he is on eliquis . I do recommend observation and PT/OT and stroke workup.  Since strokes are small is size and he has been having intermittent symptoms for 10 days now, I think reasonable to resume eliquis  with dose this AM.  RECOMMENDATIONS  - Frequent Neuro checks per stroke unit protocol - CT Chest, Abd, pelvis with contrast to evaluate for occult malignancy in the setting of concern for eliquis  failure. - Recommend obtaining Lipid panel with LDL - continue Atorvastatin  40mg  daily for now. - Recommend HbA1c again. Last was 6.6 - continue eliquis  5mg  BID. - SBP goal - aim for gradual normotension, keep SBP < 180. - Recommend Telemetry monitoring for arrythmia - Recommend bedside swallow screen prior to PO intake. - Stroke education booklet - Recommend PT/OT/SLP consult - stroke team to follow.  AKI: Defer workup to hospitalist team and ED team. They would be much better at addressing this.  ______________________________________________________________________  Plan discussed with patient and his family at the bedside. Plan also discussed with Dr. Ginger with the ED team.  I spoke to family and patient again to update them on CTA head and neck results.  Signed, Severn Goddard, MD  [facility]

## 2024-04-27 ENCOUNTER — Inpatient Hospital Stay (HOSPITAL_COMMUNITY)

## 2024-04-27 ENCOUNTER — Observation Stay (HOSPITAL_COMMUNITY)

## 2024-04-27 ENCOUNTER — Other Ambulatory Visit (HOSPITAL_COMMUNITY): Payer: Self-pay

## 2024-04-27 ENCOUNTER — Telehealth (HOSPITAL_COMMUNITY): Payer: Self-pay | Admitting: Pharmacy Technician

## 2024-04-27 DIAGNOSIS — Z789 Other specified health status: Secondary | ICD-10-CM

## 2024-04-27 DIAGNOSIS — I639 Cerebral infarction, unspecified: Secondary | ICD-10-CM | POA: Diagnosis not present

## 2024-04-27 DIAGNOSIS — I4819 Other persistent atrial fibrillation: Secondary | ICD-10-CM | POA: Diagnosis not present

## 2024-04-27 DIAGNOSIS — N179 Acute kidney failure, unspecified: Secondary | ICD-10-CM | POA: Insufficient documentation

## 2024-04-27 LAB — BASIC METABOLIC PANEL WITH GFR
Anion gap: 8 (ref 5–15)
BUN: 18 mg/dL (ref 8–23)
CO2: 27 mmol/L (ref 22–32)
Calcium: 8.2 mg/dL — ABNORMAL LOW (ref 8.9–10.3)
Chloride: 100 mmol/L (ref 98–111)
Creatinine, Ser: 1.54 mg/dL — ABNORMAL HIGH (ref 0.61–1.24)
GFR, Estimated: 49 mL/min — ABNORMAL LOW (ref >60)
Glucose, Bld: 136 mg/dL — ABNORMAL HIGH (ref 70–99)
Potassium: 3.6 mmol/L (ref 3.5–5.1)
Sodium: 135 mmol/L (ref 135–145)

## 2024-04-27 LAB — CBC
HCT: 38.8 % — ABNORMAL LOW (ref 39.0–52.0)
Hemoglobin: 13.2 g/dL (ref 13.0–17.0)
MCH: 31.3 pg (ref 26.0–34.0)
MCHC: 34 g/dL (ref 30.0–36.0)
MCV: 91.9 fL (ref 80.0–100.0)
Platelets: 137 K/uL — ABNORMAL LOW (ref 150–400)
RBC: 4.22 MIL/uL (ref 4.22–5.81)
RDW: 12.4 % (ref 11.5–15.5)
WBC: 6.7 K/uL (ref 4.0–10.5)
nRBC: 0 % (ref 0.0–0.2)

## 2024-04-27 LAB — GLUCOSE, CAPILLARY
Glucose-Capillary: 144 mg/dL — ABNORMAL HIGH (ref 70–99)
Glucose-Capillary: 132 mg/dL — ABNORMAL HIGH (ref 70–99)
Glucose-Capillary: 153 mg/dL — ABNORMAL HIGH (ref 70–99)

## 2024-04-27 LAB — HEMOGLOBIN A1C
Hgb A1c MFr Bld: 7.3 % — ABNORMAL HIGH (ref 4.8–5.6)
Mean Plasma Glucose: 163 mg/dL

## 2024-04-27 MED ORDER — DABIGATRAN ETEXILATE MESYLATE 150 MG PO CAPS
150.0000 mg | ORAL_CAPSULE | Freq: Two times a day (BID) | ORAL | 0 refills | Status: DC
  Filled 2024-04-27: qty 60, 30d supply, fill #0
  Filled 2024-05-10: qty 60, 30d supply, fill #1

## 2024-04-27 MED ORDER — DIPHENHYDRAMINE HCL 50 MG/ML IJ SOLN
50.0000 mg | Freq: Once | INTRAMUSCULAR | Status: AC
  Administered 2024-04-27: 50 mg via INTRAVENOUS
  Filled 2024-04-27: qty 1

## 2024-04-27 MED ORDER — AMLODIPINE BESYLATE 2.5 MG PO TABS
2.5000 mg | ORAL_TABLET | Freq: Every day | ORAL | Status: DC
  Administered 2024-04-27: 2.5 mg via ORAL
  Filled 2024-04-27: qty 1

## 2024-04-27 MED ORDER — MELATONIN 3 MG PO TABS: 3.0000 mg | ORAL_TABLET | Freq: Every day | ORAL | Status: DC

## 2024-04-27 MED ORDER — METOCLOPRAMIDE HCL 5 MG/ML IJ SOLN
10.0000 mg | Freq: Once | INTRAMUSCULAR | Status: AC
  Administered 2024-04-27: 10 mg via INTRAVENOUS
  Filled 2024-04-27: qty 2

## 2024-04-27 MED ORDER — STROKE: EARLY STAGES OF RECOVERY BOOK
Status: AC
  Filled 2024-04-27: qty 1

## 2024-04-27 MED ORDER — ACETAMINOPHEN 325 MG PO TABS
650.0000 mg | ORAL_TABLET | Freq: Once | ORAL | Status: AC
  Administered 2024-04-27: 650 mg via ORAL
  Filled 2024-04-27: qty 2

## 2024-04-27 MED ORDER — STROKE: EARLY STAGES OF RECOVERY BOOK: Freq: Once | Status: AC

## 2024-04-27 MED ORDER — HYDROXYZINE HCL 10 MG PO TABS
10.0000 mg | ORAL_TABLET | Freq: Three times a day (TID) | ORAL | Status: DC | PRN
  Administered 2024-04-27: 10 mg via ORAL
  Filled 2024-04-27: qty 1

## 2024-04-27 NOTE — Evaluation (Signed)
 Physical Therapy Evaluation Patient Details Name: Peter Robinson MRN: [id number] DOB: 28-Dec-1955 Today's Date: 04/27/2024  History of Present Illness  68 yo male presents with dizziness MRI (+) acute cerebellar infarct bil hemispheres. Pt with afib with RVR and AKI PMH 02/2024 L posterior MCA with aphasia, Afib on Eliquis , CAD, DM2, HTN, HLD, remote smoker  Clinical Impression  Pt presents with admitting diagnosis above. Pt able to ambulate in hallway with no AD CGA. Pt noted to occasionally drift L/R however mostly able to self correct. Able to perform DGI. Pt scored 18/24 on DGI indicating that he is a fall risk. Deficits most apparent during head turns with pt reporting increase in dizziness. PTA pt was fully independent.  Pt may benefit from OP neuro PT upon DC. PT will continue to follow.         If plan is discharge home, recommend the following: A little help with walking and/or transfers;A little help with bathing/dressing/bathroom;Assist for transportation   Can travel by private vehicle        Equipment Recommendations None recommended by PT  Recommendations for Other Services       Functional Status Assessment Patient has had a recent decline in their functional status and demonstrates the ability to make significant improvements in function in a reasonable and predictable amount of time.     Precautions / Restrictions Precautions Precautions: None Restrictions Weight Bearing Restrictions Per Provider Order: No      Mobility  Bed Mobility Overal bed mobility: Modified Independent             General bed mobility comments: HOB elevated, no assistance needed    Transfers Overall transfer level: Independent Equipment used: None Transfers: Sit to/from Stand Sit to Stand: Independent           General transfer comment: Pt quick to stand from EOB but no LOB or assistance needed, capable of being independent even though supervision provided for safety since  this was the first time pt has worked with therapy    Ambulation/Gait Ambulation/Gait assistance: Contact guard assist, Supervision Gait Distance (Feet): 350 Feet Assistive device: None Gait Pattern/deviations: Step-through pattern, Narrow base of support, Drifts right/left Gait velocity: WFL     General Gait Details: Pt able to ambulate in hallway with no AD CGA. Pt noted to occasionally drift L/R however mostly able to self correct. Able to perform DGI.  Stairs Stairs: Yes Stairs assistance: Supervision Stair Management: No rails, Alternating pattern, Forwards Number of Stairs: 2 General stair comments: Ascends and descends stairs without LOB, supervision for safety but capable of being independent  Wheelchair Mobility     Tilt Bed    Modified Rankin (Stroke Patients Only) Modified Rankin (Stroke Patients Only) Pre-Morbid Rankin Score: No significant disability Modified Rankin: No significant disability     Balance Overall balance assessment: Mild deficits observed, not formally tested                               Standardized Balance Assessment Standardized Balance Assessment : Dynamic Gait Index   Dynamic Gait Index Level Surface: Mild Impairment Change in Gait Speed: Mild Impairment Gait with Horizontal Head Turns: Moderate Impairment Gait with Vertical Head Turns: Mild Impairment Gait and Pivot Turn: Mild Impairment Step Over Obstacle: Normal Step Around Obstacles: Normal Steps: Normal Total Score: 18       Pertinent Vitals/Pain Pain Assessment Pain Assessment: No/denies pain    Home Living  Family/patient expects to be discharged to:: Private residence Living Arrangements: Alone Available Help at Discharge: Family;Friend(s);Available 24 hours/day Type of Home: Apartment Home Access: Level entry     Alternate Level Stairs-Number of Steps: 12 Home Layout: Two level;Bed/bath upstairs;1/2 bath on main level;Able to live on main level  with bedroom/bathroom Home Equipment: None Additional Comments: Pulled from previous admission. Pt stated no changes since then.    Prior Function Prior Level of Function : Independent/Modified Independent;Driving;Working/employed             Mobility Comments: no AD PTA ADLs Comments: indep; works as a sport and exercise psychologist for a [employer] Extremity Assessment: Overall WFL for tasks assessed    Lower Extremity Assessment Lower Extremity Assessment: Overall WFL for tasks assessed    Cervical / Trunk Assessment Cervical / Trunk Assessment: Normal  Communication   Communication Communication: Impaired Factors Affecting Communication: Difficulty expressing self    Cognition Arousal: Alert Behavior During Therapy: WFL for tasks assessed/performed   PT - Cognitive impairments: No apparent impairments                       PT - Cognition Comments: A&Ox4. Recalls where his room is easily with visual landmarks and does not need help for path finding. Can be quick to move, but suspect this is due to some anxiety in regards to situation and not so much impulsivity. Follows multi step commands. Likely at baseline, but could benefit from a formal cog assessment Following commands: Intact       Cueing Cueing Techniques: Verbal cues, Visual cues     General Comments General comments (skin integrity, edema, etc.): VSS    Exercises     Assessment/Plan    PT Assessment Patient needs continued PT services  PT Problem List Decreased strength;Decreased range of motion;Decreased activity tolerance;Decreased balance;Decreased coordination;Decreased mobility;Decreased knowledge of use of DME;Decreased safety awareness;Decreased knowledge of precautions;Cardiopulmonary status limiting activity       PT Treatment Interventions DME instruction;Gait training;Stair training;Functional mobility training;Therapeutic  activities;Therapeutic exercise;Balance training;Neuromuscular re-education;Cognitive remediation;Patient/family education    PT Goals (Current goals can be found in the Care Plan section)  Acute Rehab PT Goals Patient Stated Goal: to recover fully PT Goal Formulation: With patient Time For Goal Achievement: 05/11/24 Potential to Achieve Goals: Good    Frequency Min 2X/week     Co-evaluation               AM-PAC PT 6 Clicks Mobility  Outcome Measure Help needed turning from your back to your side while in a flat bed without using bedrails?: None Help needed moving from lying on your back to sitting on the side of a flat bed without using bedrails?: None Help needed moving to and from a bed to a chair (including a wheelchair)?: None Help needed standing up from a chair using your arms (e.g., wheelchair or bedside chair)?: None Help needed to walk in hospital room?: A Little Help needed climbing 3-5 steps with a railing? : A Little 6 Click Score: 22    End of Session Equipment Utilized During Treatment: Gait belt Activity Tolerance: Patient tolerated treatment well Patient left: in bed;with call bell/phone within reach;with family/visitor present Nurse Communication: Mobility status PT Visit Diagnosis: Other symptoms and signs involving the nervous system (R29.898)    Time: 9063-9044 PT Time Calculation (min) (ACUTE ONLY): 19 min   Charges:   PT Evaluation $PT  Eval Moderate Complexity: 1 Mod   PT General Charges $$ ACUTE PT VISIT: 1 Visit         Sueellen NOVAK, PT, DPT Acute Rehab Services [phone]   Jovani Colquhoun 04/27/2024, 12:33 PM

## 2024-04-27 NOTE — Discharge Instructions (Addendum)
 Thank you for letting us  care for you during your stay.  You were admitted to the [facility] Medicine Teaching Service.   You were admitted for a cerebellar stroke that was seen on brain MRI. The stroke was thought to be due to by failure of eliquis , your blood thinner, to adequately thin your blood, which is not common. Because of this you were transitioned to a different blood thinner, Pradaxa, on discharge. Please take this medication twice a day EVERY DAY as this is very important to prevent future strokes. We also referred you to see Dr. Kennyth, cardiology, for further outpatient evaluation and management of your atrial fibrillation.  Additional imaging was performed during your hospitalization:  CT chest/abdomen/pelvis did not show any signs of malignancy/cancer, which was reassuring.  CT of your head and neck did not show any occlusion of the large blood vessels supplying blood to your brain, which is reassuring.   You were also found to have a kidney injury during the hospitalization, which was determined by elevation in your serum creatinine. Additional studies investigating why you had a kidney injury were not resulted at the time of your discharge, so we recommend that your PCP follow up on these studies and recheck your creatinine after discharge.   Please follow up with your primary care physician within 1 week.   Please follow up with Dr. Kennyth, cardiology, on 04/29/24 at 1:45 pm. Arrive at 1:25 pm. The address is [address], [place], [place] [address].   If your symptoms worsen or return, please return to the hospital.  Please let us  know if you have questions about your stay at [facility]

## 2024-04-27 NOTE — Care Management CC44 (Signed)
 Condition Code 44 Documentation Completed  Patient Details  Name: Lamount Bankson MRN: [id number] Date of Birth: 09/18/1955   Condition Code 44 given:  Yes Patient signature on Condition Code 44 notice:  Yes Documentation of 2 MD's agreement:  Yes Code 44 added to claim:  Yes    Stephane Powell Jansky, RN 04/27/2024, 3:20 PM

## 2024-04-27 NOTE — Progress Notes (Signed)
 PT ready for DC. VSS. IV removed. All DC materials reviewed with patient and family. Dcing home with sister.

## 2024-04-27 NOTE — Evaluation (Signed)
 SLP Cancellation Note  Patient Details Name: Peter Robinson MRN: [id number] DOB: [birth year]   Cancelled treatment:       Reason Eval/Treat Not Completed: Other (comment) (pt working with therapy at this time and then for echo, will continue efforts)  Madelin POUR, MS [facility] SLP Acute Rehab Services Office [phone]  Nicolas Emmie Caldron 04/27/2024, 9:45 AM

## 2024-04-27 NOTE — Progress Notes (Signed)
 Pt requested referral for cardiologist suggested by Dr. Rosemarie for watchman. Neuro and Hospitalist team made aware that patient requests this on DC.

## 2024-04-27 NOTE — Assessment & Plan Note (Signed)
 T2DM: hold home glipizide, metformin. Start moderate SSI, CBG ACHS; will adjust regimen based on glucose.  HTN: hold amlodipine , doxazosin, lasix , losartan . Will gradually restart for SBP <180, per neuro.  HLD: lipitor as above Insomnia: hold Gabapentin  1200 mg at bedtime while acutely dizzy

## 2024-04-27 NOTE — Discharge Summary (Addendum)
 Family Medicine Teaching [facility] Discharge Summary  Patient name: Peter Robinson Medical record number: [id number] Date of birth: March 18, 1956 Age: 68 y.o. 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  His hospital course is detailed below:  Stroke Patient presented to ED after 1 week of intermittent dizziness and tunnel vision.  In an outside ED, he was found to be tachycardic, in atrial fibrillation, with CT head without acute findings, but concern for stroke.  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  Patient reported compliance with twice daily Eliquis  since last stroke at the end of October 2025.  Neurology consulted for assistance with stroke management.  CT chest/abdomen/pelvis without evidence of malignancy.  Echocardiogram was obtained during October admission and was normal; therefore, repeat echocardiogram was not performed this admission.  Stroke thought to be secondary to Eliquis  failure.  Patient discharged with  Pradaxa 150 mg twice daily and appointment with Dr. Kennyth, cardiologist, for outpatient evaluation for possible Watchman and ablation.  AKI Patient's creatinine elevated above baseline during this admission with proteinuria on microscopy.  Serum albumin and platelets not consistent with nephrotic syndrome.  Patient with subjective reports of normal urine output and hydration.  Nephrotoxic agents (lasix , losartan , diclofenac) held during this admission. FeUrea and PVR ordered, but patient wanting to discharge and otherwise medically stable for discharge prior to results and further treatment.  Recommend further follow-up outpatient.  T2DM Patient's glipizide, metformin and lantus 20 units held this admission. CBG <180. Glipizide and metformin restarted at discharge, but lantus held. A1c in process at time of discharge, but October 2025 A1c 6.6  PCP recommendations: Cardiology appointment outpatient Follow up urine studies, repeat serum creatinine  Consider restarting lasix  and losartan  Review med rec as mutliple medications paused on discharge (prednisone PRN and lantus in addition to nephrotoxic agents)     Results/Tests Pending at Time of Discharge:  Unresulted Labs (From admission, onward)     Start     Ordered   04/27/24 1056  Creatinine, urine, random  Once,   R        04/27/24 1056   04/27/24 1052  Urea nitrogen, urine  Once,   R        04/27/24 1056   04/26/24 1325  Hemoglobin A1c  Add-on,   AD        04/26/24 1324             Disposition: Home  Discharge Condition: stable  Discharge Exam:  Vitals:   04/27/24 0757 04/27/24 1137  BP: (!) 135/92 (!) 153/94  Pulse: 96 91  Resp: 16 18  Temp: 98.3 F (36.8 C) 97.7 F (36.5  C)  SpO2: 96% 95%   General: well appearing man sitting up in bed Cardiovascular: irregular rhythm, regular rate, 2+ radial pulse Respiratory: no increased WOB Abdomen: soft, nontender Neuro: bilaterally 5/5 UE and LE strength, PERRL, speech  is goal oriented and without delay or slow, no pronator dirft, slow but accurate finger to nose testing  Significant Procedures: None  Significant Labs and Imaging:  Recent Labs  Lab 04/26/24 0629 04/27/24 0232  WBC 10.1 6.7  HGB 14.8 13.2  HCT 43.6 38.8*  PLT 156 137*   Recent Labs  Lab 04/26/24 0629 04/27/24 0232  NA 138 135  K 3.8 3.6  CL 101 100  CO2 25 27  GLUCOSE 220* 136*  BUN 19 18  CREATININE 1.58* 1.54*  CALCIUM  8.7* 8.2*  ALKPHOS 80  --   AST 64*  --   ALT 59*  --   ALBUMIN 3.8  --     CT CHEST ABDOMEN PELVIS W CONTRAST Result Date: 04/26/2024 EXAM: CT CHEST, ABDOMEN AND PELVIS WITH CONTRAST 04/26/2024 12:08:43 PM TECHNIQUE: CT of the chest, abdomen and pelvis was performed with the administration of 75 mL of iohexol  (OMNIPAQUE ) 350 MG/ML intravenous contrast. Multiplanar reformatted images are provided for review. Automated exposure control, iterative reconstruction, and/or weight based adjustment of the mA/kV was utilized to reduce the radiation dose to as low as reasonably achievable. COMPARISON: Chest CT 02/06/2022 and report from chest CT from 06/24/2022. CLINICAL HISTORY: Vertigo, stroke 1 month ago. Dizziness. Acute cerebellar CVA (Cerebrovascular Accident). FINDINGS: CHEST: MEDIASTINUM AND LYMPH NODES: Heart and pericardium are unremarkable. The central airways are clear. No mediastinal, hilar or axillary lymphadenopathy. LUNGS AND PLEURA: Mild scarring or atelectasis in both lower lobes. 2 x 3 mm right lower lobe nodule on image 112 series 4. No focal consolidation or pulmonary edema. No pleural effusion or pneumothorax. ABDOMEN AND PELVIS: LIVER: The liver is unremarkable. GALLBLADDER AND BILE DUCTS: Gallbladder is unremarkable. No biliary ductal dilatation. SPLEEN: No acute abnormality. PANCREAS: No acute abnormality. ADRENAL GLANDS: 0.9 cm known right adrenal adenoma, image 69 series 3. No further imaging workup of this lesion is indicated. KIDNEYS, URETERS AND  BLADDER: No stones in the kidneys or ureters. No hydronephrosis. No perinephric or periureteral stranding. Urinary bladder is unremarkable. GI AND BOWEL: Stomach demonstrates no acute abnormality. Descending and sigmoid colon diverticulosis. There is no bowel obstruction. REPRODUCTIVE ORGANS: No acute abnormality. PERITONEUM AND RETROPERITONEUM: No ascites. No free air. VASCULATURE: Aorta is normal in caliber. Thoracic aortic, coronary artery, and branch vessel atheromatous vascular calcifications. Mild aortoiliac atheromatous vascular calcification. ABDOMINAL AND PELVIS LYMPH NODES: No lymphadenopathy. BONES AND SOFT TISSUES: Bridging spur of the right sacroiliac joint. Mild lower lumbar spondylosis and degenerative disc disease. No focal soft tissue abnormality. IMPRESSION: 1. No acute findings in the chest, abdomen, and pelvis related to vertigo, recent stroke, or dizziness. No compelling findings of active malignancy. 2. Incidental 2 x 3 mm solid right lower lobe pulmonary nodule without high-risk features; per Fleischner Society Guidelines, no routine follow-up imaging is recommended. 3. Known 0.9 cm right adrenal adenoma; no follow-up imaging is recommended. 4. Mild lower lumbar spondylosis and degenerative disc disease. 5. Bridging spur of the right sacroiliac joint. 6. Atherosclerosis. Electronically signed by: Ryan Salvage MD 04/26/2024 01:33 PM EST RP Workstation: [id number]   CT ANGIO HEAD NECK W WO CM Result Date: 04/26/2024 EXAM: CTA HEAD AND NECK WITH AND WITHOUT TECHNIQUE: CTA of the head and neck was performed with and without the administration of intravenous  contrast. The contrast dosage was 75mL (iohexol  (OMNIPAQUE ) 350 MG/ML injection 75 mL IOHEXOL  350 MG/ML SOLN). Multiplanar 2D and/or 3D reformatted images are provided for review. Automated exposure control, iterative reconstruction, and/or weight-based adjustment of the mA/kV was utilized to reduce the radiation dose to as low as  reasonably achievable. Stenosis of the internal carotid arteries measured using NASCET criteria. COMPARISON: CTA head and neck 03/18/24 CLINICAL HISTORY: Acute stroke on MRI FINDINGS: CTA NECK: AORTIC ARCH AND ARCH VESSELS: Mild atherosclerotic calcification at the origins of both subclavian arteries without evidence of a significant stenosis. CERVICAL CAROTID ARTERIES: Small amount of mixed plaque about both carotid bifurcations. No evidence of a significant stenosis or dissection. CERVICAL VERTEBRAL ARTERIES: Dominant left vertebral artery. No evidence of a significant stenosis or dissection. BONES: Moderate multilevel lower cervical disc degeneration. Focally severe right facet arthrosis at C3-C4. LUNGS AND MEDIASTINUM: Unremarkable. SOFT TISSUES: No acute abnormality. CTA HEAD: ANTERIOR CIRCULATION: The internal carotid arteries are patent from skull base to carotid termini with atherosclerosis not resulting in a significant stenosis. The ACAs and MCAs are patent without evidence of a proximal branch occlusion or significant A1 or M1 stenosis. There is a severe proximal left M3 branch stenosis corresponding to the location of the branch occlusion on the prior CTA. No aneurysm. POSTERIOR CIRCULATION: The intracranial vertebral arteries are patent to the basilar with mild atherosclerosis of the left V4 segment not resulting in significant stenosis. Patent right PICA, bilateral AICA, and bilateral SCA origins are visualized. The basilar artery is widely patent. There is a fetal origin of the right PCA. The PCAs are patent with branch vessel irregularity but no evidence of a flow-limiting proximal stenosis. OTHER: No dural venous sinus thrombosis on this non-dedicated study. IMPRESSION: 1. No large vessel occlusion. 2. 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 3. Mild carotid atherosclerosis without significant stenosis. Electronically signed by: Dasie Hamburg MD 04/26/2024 11:27 AM EST RP Workstation:  [id number]   MR BRAIN WO CONTRAST Result Date: 04/26/2024 EXAM: MRI BRAIN WITHOUT CONTRAST 04/26/2024 09:37:50 AM TECHNIQUE: Multiplanar multisequence MRI of the head/brain was performed without the administration of intravenous contrast. COMPARISON: None available. CLINICAL HISTORY: Syncope/presyncope, cerebrovascular cause suspected. FINDINGS: BRAIN AND VENTRICLES: There are several foci of restricted diffusion present within the right cerebellar hemisphere. There is also a focus of restricted diffusion in the right cerebellar vermis and medially within the left cerebellar hemisphere. There is questionable residual restricted diffusion present within the left parietal lobe at the site of the previously noted acute infarct. There is age-related atrophy and moderately advanced diffuse cerebral white matter disease. No intracranial hemorrhage. No mass. No midline shift. No hydrocephalus. The sella is unremarkable. Normal flow voids. ORBITS: No acute abnormality. SINUSES AND MASTOIDS: No acute abnormality. BONES AND SOFT TISSUES: Normal marrow signal. No acute soft tissue abnormality. IMPRESSION: 1. Acute infarcts in the right cerebellar hemisphere, right cerebellar vermis, and medially within the left cerebellar hemisphere. 2. Questionable residual restricted diffusion in the left parietal lobe at the site of a previously noted acute infarct. 3. Moderately advanced diffuse cerebral white matter disease. Electronically signed by: Evalene Coho MD 04/26/2024 10:13 AM EST RP Workstation: [id number]   CT Head Wo Contrast Result Date: 04/26/2024 EXAM: CT HEAD WITHOUT CONTRAST 04/26/2024 06:57:00 AM TECHNIQUE: CT of the head was performed without the administration of intravenous contrast. Automated exposure control, iterative reconstruction, and/or weight based adjustment of the mA/kV was utilized to reduce the radiation dose to as low as reasonably achievable. COMPARISON:  MRI brain 03/19/2024 and CT head  03/18/2024. CLINICAL HISTORY: Syncope/presyncope, cerebrovascular cause suspected. FINDINGS: BRAIN AND VENTRICLES: No acute hemorrhage. No cortical-based acute infarct. No mass effect or midline shift. No extra-axial collection. There are small chronic infarcts in the left parietal lobe which were previously evolving. There is mild atrophy and atrophic ventriculomegaly with moderately advanced small vessel disease of the cerebral white matter. There is patchy calcification in the siphons without hyperdense vessel. The basal cisterns are clear. ORBITS: There are old lens replacements with no acute orbital findings. SINUSES: There is membrane thickening in the ethmoid air cells. Other paranasal sinuses, bilateral mastoid air cells and middle ears are clear. The nasal septum is S-shaped. SOFT TISSUES AND SKULL: No acute soft tissue abnormality. No skull fracture. IMPRESSION: 1. No acute intracranial CT findings. 2. Small chronic infarcts in the left parietal lobe, previously evolving. 3. Atrophy and small vessel changes. Carotid atherosclerosis. Electronically signed by: Francis Quam MD 04/26/2024 07:25 AM EST RP Workstation: [id number]      Discharge Medications:  Allergies as of 04/27/2024   No Known Allergies      Medication List     PAUSE taking these medications    diclofenac 75 MG EC tablet Wait to take this until your doctor or other care provider tells you to start again. Commonly known as: VOLTAREN Take 75 mg by mouth 2 (two) times daily as needed for mild pain (pain score 1-3) (playing golf).   furosemide  20 MG tablet Wait to take this until your doctor or other care provider tells you to start again. Commonly known as: LASIX  Take 20 mg by mouth daily.   Lantus SoloStar 100 UNIT/ML Solostar Pen Wait to take this until your doctor or other care provider tells you to start again. Generic drug: insulin  glargine Inject 20-25 Units into the skin daily.   losartan  100 MG tablet Wait  to take this until your doctor or other care provider tells you to start again. Commonly known as: COZAAR  Take 100 mg by mouth daily.   predniSONE 20 MG tablet Wait to take this until your doctor or other care provider tells you to start again. Commonly known as: DELTASONE Take 20 mg by mouth daily as needed (inflammation).       STOP taking these medications    Eliquis  5 MG Tabs tablet Generic drug: apixaban        TAKE these medications    amLODipine  2.5 MG tablet Commonly known as: NORVASC  Take 2.5 mg by mouth daily.   atorvastatin  40 MG tablet Commonly known as: LIPITOR Take 40 mg by mouth daily.   carvedilol  12.5 MG tablet Commonly known as: COREG  Take 12.5 mg by mouth 2 (two) times daily.   dabigatran  150 MG Caps capsule Commonly known as: Pradaxa  Take 1 capsule (150 mg total) by mouth 2 (two) times daily.   doxazosin 2 MG tablet Commonly known as: CARDURA Take 2 mg by mouth daily.   gabapentin  600 MG tablet Commonly known as: NEURONTIN  Take 1,200 mg by mouth at bedtime.   glipiZIDE 5 MG 24 hr tablet Commonly known as: GLUCOTROL XL Take 5 mg by mouth daily with breakfast.   metFORMIN 500 MG 24 hr tablet Commonly known as: GLUCOPHAGE-XR Take 500 mg by mouth daily.        Discharge Instructions: Please refer to Patient Instructions section of EMR for full details.  Patient was counseled important signs and symptoms that should prompt return to medical care, changes in medications, dietary  instructions, activity restrictions, and follow up appointments.   Follow-Up Appointments: Patient will schedule with PCP in the next week Has appt with Cardiology on 04/29/24   Lonnie Earnest, MD 04/27/2024, 3:05 PM PGY-2, [facility]

## 2024-04-27 NOTE — Telephone Encounter (Signed)
 Patient Product/process Development Scientist completed.    The patient is insured through [facility]. Patient has Medicare and is not eligible for a copay card, but may be able to apply for patient assistance or Medicare RX Payment Plan (Patient Must reach out to their plan, if eligible for payment plan), if available.    Ran test claim for dabigatran (Pradaxa) 150 mg and the current 30 day co-pay is $99.00.   This test claim was processed through [facility]- copay amounts may vary at other pharmacies due to pharmacy/plan contracts, or as the patient moves through the different stages of their insurance plan.     Reyes Sharps, CPHT Pharmacy Technician Patient Advocate Specialist Lead [facility] Patient Advocate Team Direct Number: [phone]  Fax: [phone]

## 2024-04-27 NOTE — Progress Notes (Signed)
 Attempted 2D echocardiogram. Scheduled patient for transport to echo lab. Transport called and said they were cancelling as they were informed MD said to cancel echo order.

## 2024-04-27 NOTE — Care Management Obs Status (Signed)
 MEDICARE OBSERVATION STATUS NOTIFICATION   Patient Details  Name: Peter Robinson MRN: [id number] Date of Birth: 02-Jan-1956   Medicare Observation Status Notification Given:  Yes    Stephane Powell Jansky, RN 04/27/2024, 3:20 PM

## 2024-04-27 NOTE — Assessment & Plan Note (Signed)
 LDL 64.  - continue telemetry - Neurology following, appreciate recs  - Neuro checks per stroke protocol  - awaiting HgbA1C  - SBP <180, gradual normotension - follow echo - continue lipitor, will not adjust dosing given LDL - continue eliquis , though pending echo may consider changing if this stroke is considered eliquis  treatment failure without evidence of other etiology - AM CBC, follow platelets

## 2024-04-27 NOTE — Assessment & Plan Note (Signed)
 Baseline creatine ~1.15 (based on two labs in the last year). Elevated at 1.58 today with proteinuria. Albumin is normal. Eval for hypercoagulability due to possible nephrotic syndrome.  - avoid nephrotoxic agents - AM BMP  - PT/INR

## 2024-04-27 NOTE — Progress Notes (Addendum)
 STROKE TEAM PROGRESS NOTE    SIGNIFICANT HOSPITAL EVENTS  12/1: Presented with intermittent vertigo, disequilibrium for last 10 days. Imaging revealed new tiny cerebellar embolic infarcts   INTERIM HISTORY/SUBJECTIVE  Bedside.  Patient sitting up in bed, no neurological complaints.  Thorough discussion held at bedside regarding Eliquis , Pradaxa, Watchman device and future treatment of his A-fib and increased risk of further strokes.   OBJECTIVE  CBC    Component Value Date/Time   WBC 6.7 04/27/2024 0232   RBC 4.22 04/27/2024 0232   HGB 13.2 04/27/2024 0232   HCT 38.8 (L) 04/27/2024 0232   PLT 137 (L) 04/27/2024 0232   MCV 91.9 04/27/2024 0232   MCH 31.3 04/27/2024 0232   MCHC 34.0 04/27/2024 0232   RDW 12.4 04/27/2024 0232   LYMPHSABS 0.3 (L) 04/26/2024 0629   MONOABS 0.2 04/26/2024 0629   EOSABS 0.0 04/26/2024 0629   BASOSABS 0.0 04/26/2024 0629    BMET    Component Value Date/Time   NA 135 04/27/2024 0232   K 3.6 04/27/2024 0232   CL 100 04/27/2024 0232   CO2 27 04/27/2024 0232   GLUCOSE 136 (H) 04/27/2024 0232   BUN 18 04/27/2024 0232   CREATININE 1.54 (H) 04/27/2024 0232   CALCIUM  8.2 (L) 04/27/2024 0232   GFRNONAA 49 (L) 04/27/2024 0232    IMAGING past 24 hours CT CHEST ABDOMEN PELVIS W CONTRAST Result Date: 04/26/2024 EXAM: CT CHEST, ABDOMEN AND PELVIS WITH CONTRAST 04/26/2024 12:08:43 PM TECHNIQUE: CT of the chest, abdomen and pelvis was performed with the administration of 75 mL of iohexol  (OMNIPAQUE ) 350 MG/ML intravenous contrast. Multiplanar reformatted images are provided for review. Automated exposure control, iterative reconstruction, and/or weight based adjustment of the mA/kV was utilized to reduce the radiation dose to as low as reasonably achievable. COMPARISON: Chest CT 02/06/2022 and report from chest CT from 06/24/2022. CLINICAL HISTORY: Vertigo, stroke 1 month ago. Dizziness. Acute cerebellar CVA (Cerebrovascular Accident). FINDINGS: CHEST:  MEDIASTINUM AND LYMPH NODES: Heart and pericardium are unremarkable. The central airways are clear. No mediastinal, hilar or axillary lymphadenopathy. LUNGS AND PLEURA: Mild scarring or atelectasis in both lower lobes. 2 x 3 mm right lower lobe nodule on image 112 series 4. No focal consolidation or pulmonary edema. No pleural effusion or pneumothorax. ABDOMEN AND PELVIS: LIVER: The liver is unremarkable. GALLBLADDER AND BILE DUCTS: Gallbladder is unremarkable. No biliary ductal dilatation. SPLEEN: No acute abnormality. PANCREAS: No acute abnormality. ADRENAL GLANDS: 0.9 cm known right adrenal adenoma, image 69 series 3. No further imaging workup of this lesion is indicated. KIDNEYS, URETERS AND BLADDER: No stones in the kidneys or ureters. No hydronephrosis. No perinephric or periureteral stranding. Urinary bladder is unremarkable. GI AND BOWEL: Stomach demonstrates no acute abnormality. Descending and sigmoid colon diverticulosis. There is no bowel obstruction. REPRODUCTIVE ORGANS: No acute abnormality. PERITONEUM AND RETROPERITONEUM: No ascites. No free air. VASCULATURE: Aorta is normal in caliber. Thoracic aortic, coronary artery, and branch vessel atheromatous vascular calcifications. Mild aortoiliac atheromatous vascular calcification. ABDOMINAL AND PELVIS LYMPH NODES: No lymphadenopathy. BONES AND SOFT TISSUES: Bridging spur of the right sacroiliac joint. Mild lower lumbar spondylosis and degenerative disc disease. No focal soft tissue abnormality. IMPRESSION: 1. No acute findings in the chest, abdomen, and pelvis related to vertigo, recent stroke, or dizziness. No compelling findings of active malignancy. 2. Incidental 2 x 3 mm solid right lower lobe pulmonary nodule without high-risk features; per Fleischner Society Guidelines, no routine follow-up imaging is recommended. 3. Known 0.9 cm right adrenal adenoma; no  follow-up imaging is recommended. 4. Mild lower lumbar spondylosis and degenerative disc  disease. 5. Bridging spur of the right sacroiliac joint. 6. Atherosclerosis. Electronically signed by: Ryan Salvage MD 04/26/2024 01:33 PM EST RP Workstation: [id number]   CT ANGIO HEAD NECK W WO CM Result Date: 04/26/2024 EXAM: CTA HEAD AND NECK WITH AND WITHOUT TECHNIQUE: CTA of the head and neck was performed with and without the administration of intravenous contrast. The contrast dosage was 75mL (iohexol  (OMNIPAQUE ) 350 MG/ML injection 75 mL IOHEXOL  350 MG/ML SOLN). Multiplanar 2D and/or 3D reformatted images are provided for review. Automated exposure control, iterative reconstruction, and/or weight-based adjustment of the mA/kV was utilized to reduce the radiation dose to as low as reasonably achievable. Stenosis of the internal carotid arteries measured using NASCET criteria. COMPARISON: CTA head and neck 03/18/24 CLINICAL HISTORY: Acute stroke on MRI FINDINGS: CTA NECK: AORTIC ARCH AND ARCH VESSELS: Mild atherosclerotic calcification at the origins of both subclavian arteries without evidence of a significant stenosis. CERVICAL CAROTID ARTERIES: Small amount of mixed plaque about both carotid bifurcations. No evidence of a significant stenosis or dissection. CERVICAL VERTEBRAL ARTERIES: Dominant left vertebral artery. No evidence of a significant stenosis or dissection. BONES: Moderate multilevel lower cervical disc degeneration. Focally severe right facet arthrosis at C3-C4. LUNGS AND MEDIASTINUM: Unremarkable. SOFT TISSUES: No acute abnormality. CTA HEAD: ANTERIOR CIRCULATION: The internal carotid arteries are patent from skull base to carotid termini with atherosclerosis not resulting in a significant stenosis. The ACAs and MCAs are patent without evidence of a proximal branch occlusion or significant A1 or M1 stenosis. There is a severe proximal left M3 branch stenosis corresponding to the location of the branch occlusion on the prior CTA. No aneurysm. POSTERIOR CIRCULATION: The intracranial  vertebral arteries are patent to the basilar with mild atherosclerosis of the left V4 segment not resulting in significant stenosis. Patent right PICA, bilateral AICA, and bilateral SCA origins are visualized. The basilar artery is widely patent. There is a fetal origin of the right PCA. The PCAs are patent with branch vessel irregularity but no evidence of a flow-limiting proximal stenosis. OTHER: No dural venous sinus thrombosis on this non-dedicated study. IMPRESSION: 1. No large vessel occlusion. 2. 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 3. Mild carotid atherosclerosis without significant stenosis. Electronically signed by: Dasie Hamburg MD 04/26/2024 11:27 AM EST RP Workstation: [id number]   MR BRAIN WO CONTRAST Result Date: 04/26/2024 EXAM: MRI BRAIN WITHOUT CONTRAST 04/26/2024 09:37:50 AM TECHNIQUE: Multiplanar multisequence MRI of the head/brain was performed without the administration of intravenous contrast. COMPARISON: None available. CLINICAL HISTORY: Syncope/presyncope, cerebrovascular cause suspected. FINDINGS: BRAIN AND VENTRICLES: There are several foci of restricted diffusion present within the right cerebellar hemisphere. There is also a focus of restricted diffusion in the right cerebellar vermis and medially within the left cerebellar hemisphere. There is questionable residual restricted diffusion present within the left parietal lobe at the site of the previously noted acute infarct. There is age-related atrophy and moderately advanced diffuse cerebral white matter disease. No intracranial hemorrhage. No mass. No midline shift. No hydrocephalus. The sella is unremarkable. Normal flow voids. ORBITS: No acute abnormality. SINUSES AND MASTOIDS: No acute abnormality. BONES AND SOFT TISSUES: Normal marrow signal. No acute soft tissue abnormality. IMPRESSION: 1. Acute infarcts in the right cerebellar hemisphere, right cerebellar vermis, and medially within the left cerebellar  hemisphere. 2. Questionable residual restricted diffusion in the left parietal lobe at the site of a previously noted acute infarct. 3. Moderately advanced diffuse  cerebral white matter disease. Electronically signed by: Evalene Coho MD 04/26/2024 10:13 AM EST RP Workstation: [id number]    Vitals:   04/26/24 1600 04/26/24 1959 04/26/24 2353 04/27/24 0757  BP: (!) 143/87 (!) 150/102 (!) 133/90 (!) 135/92  Pulse:  (!) 112 96 96  Resp: 18 18 15 16   Temp: 98.6 F (37 C) 99 F (37.2 C) 98.5 F (36.9 C) 98.3 F (36.8 C)  TempSrc: Oral Oral Oral Oral  SpO2: 93% 94% 95% 96%  Weight:      Height:         PHYSICAL EXAM General:  Alert, well-nourished, well-developed patient in no acute distress CV: Afib to SR with PVCs on monitor Respiratory:  Regular, unlabored respirations on room air  NEURO:  Mental Status: AA&Ox3, patient is able to give clear and coherent history Speech/Language: speech is without dysarthria or aphasia.  Naming, repetition, fluency, and comprehension intact.  Cranial Nerves:  II: PERRL. Visual fields full.  III, IV, VI: EOMI. Eyelids elevate symmetrically.  V: Sensation is intact to light touch and symmetrical to face.  VII: Face is symmetrical resting and smiling VIII: hearing intact to voice. IX, X: Palate elevates symmetrically. Phonation is normal.  KP:Dynloizm shrug 5/5. XII: tongue is midline without fasciculations. Motor: 5/5 strength to all muscle groups tested.  Tone: is normal and bulk is normal Sensation- Intact to light touch bilaterally. Extinction absent to light touch to DSS.   Coordination: FTN intact bilaterally, HKS: no ataxia in BLE.No drift.  Gait- deferred  Most Recent NIH: 0.    ASSESSMENT/PLAN  [name] is a 69 y.o. male with history of afib on eliquis  and compliant now, former smoker who quit 3 years ago, hx of DM2, HTN, HLD who presents with intermittent vertigo, dysequilibrium for the last 10 days and headache,  admitted for stroke workup.  Imaging revealed bilateral cerebellar strokes. NIH on Admission: 0.  Acute Ischemic  Infarcts: bilateral Cerebellum, right > left Etiology:  Afib on OAC with eliquis  failure  CT Head without contrast(Personally reviewed): negative for a large hypodensity concerning for a large territory infarct or hyperdensity concerning for an ICH CT angio Head and Neck with contrast(Personally reviewed): No LVO, stable from prior in oct 2025. MRI Brain(Personally reviewed): Embolic appearing stroke in BL cerebellum, right worse than left. 2D Echo 03/19/2024: LVEF 50 to 55%, mildly dilated left atria, trivial MVR No need for repeat echo this admission LDL 64 HgbA1c 6.6 VTE prophylaxis - Eliquis  Eliquis  prior to admission, now on Eliquis . Discussed transition to Pradaxa with patient and family. Pending their further discussion and choice on medication preference going further.   Ok with switching patient to Pradaxa on discharge if he decides to.  Therapy recommendations:  Outpatient PT/OT/ST Disposition:  pending, likely home 12/2  Hx of Stroke/TIA 03/19/2024: Left posterior MCA territory infarcts Has not had a neurology follow-up appointment from this stroke yet  Atrial fibrillation Home Meds: Eliquis   Continue telemetry monitoring Begin anticoagulation with Eliquis . Discussed transition to Pradaxa with patient and family. Pending their further discussion and choice on medication preference going further.  Ok with switching patient to Pradaxa on discharge if he decides to.  Recommend outpatient cardiology referral for possible Watchman device.  Hypertension Home meds:  carvedilol , furosemide , amlodipine , losartan   Stable BP goal: normotensive as he is outside of the permissive hypertension window.    Hyperlipidemia Home meds:  atorvastatin  80mg  (increased in October 2025) LDL 64, goal < 70 (improved since October) Continue statin  at discharge  Diabetes type II  Controlled Home meds:  Metformin, Lantus, glipizide  HgbA1c 6.6, goal < 7.0 CBGs SSI Recommend close follow-up with PCP for better DM control  Other Stroke Risk Factors Obstructive sleep apnea, not on CPAP at home  Hospital day # 1  Patient is OK for discharge from neurology standpoint, with recommendations as above. Follow-up with outpatient neurology in 8 weeks.  Recommended cardiology outpatient referral for possible Watchman device, Dr. Fonda Kitty.   Pt seen by Neuro NP/APP with MD. Note/plan to be edited by MD as needed.    Rocky JAYSON Likes, DNP, AGACNP-BC [facility] Please use AMION for contact information & EPIC for messaging.  I have personally obtained history,examined this patient, reviewed notes, independently viewed imaging studies, participated in medical decision making and plan of care.ROS completed by me personally and pertinent positives fully documented  I have made any additions or clarifications directly to the above note. Agree with note above.  Patient presented with 1 week history of dizziness imbalance and generalized weakness with MRI scan showing bilateral cerebellar acute embolic infarcts.  Patient had recent embolic left parietal infarct secondary to Eliquis  noncompliance but insists he has been quite compliant with it for the last 4 to 6 weeks.  Long discussion with patient and wife and daughter about treatment options of continuing Eliquis  versus switching to Pradaxa as well as continue considering Watchman device due to Eliquis  failure.  Patient willing to switch his cardiology care from [facility] to [facility] and this will send referral to Dr. Fonda Kitty cardiac electrophysiologist to discuss Watchman device electively as an outpatient.  Maintain aggressive risk factor modification.  Patient willing to change from Eliquis  to Pradaxa.   I personally spent a total of 50 minutes in the care of the patient today including getting/reviewing  separately obtained history, performing a medically appropriate exam/evaluation, counseling and educating, placing orders, referring and communicating with other health care professionals, documenting clinical information in the EHR, independently interpreting results, and coordinating care.       Thank you Eather Popp, MD Medical Director [facility] Pager: [phone] 04/27/2024 3:10 PM

## 2024-04-27 NOTE — Progress Notes (Addendum)
 Daily Progress Note Intern Pager: [phone]  Patient name: Peter Robinson Medical record number: [id number] Date of birth: 1955-07-25 Age: 68 y.o. Gender: male  Primary Care Provider: Marsa Miquel Faden, MD Consultants: neurology Code Status: full  Pt Overview and Major Events to Date:  12/1 - admitted  Medical Decision Making:  This is a 68 y.o. male with PMH of CVA, afib,  HLD, HTN, T2DM admitted for acute cerebellar strokes in the setting of medication compliance with eliquis . Possibly represents a treatment failure, but will also evaluate for atrial clot. CT chest/abdomen/pelvis without evidence of malignancy, also INR elevated. Appreciate neurology recs in ongoing evaluation.  Assessment & Plan Acute cerebrovascular accident (CVA) of cerebellum (HCC) LDL 64.  - continue telemetry - Neurology following, appreciate recs  - Neuro checks per stroke protocol  - awaiting HgbA1C  - SBP <180, gradual normotension - follow echo - continue lipitor, will not adjust dosing given LDL - continue eliquis , though pending echo may consider changing if this stroke is considered eliquis  treatment failure without evidence of other etiology - AM CBC, follow platelets Persistent atrial fibrillation (HCC) Telemetry revelaed atrial fibrillation but rate control <110 bpm - telemetry - eliquis  as above - continue coreg  12.5 BID - echo as above AKI (acute kidney injury) Creatinine remains elevated today. Nephrotic syndrome unlikely despite proteinuria given normal albumin, thrombocytopenia. Given lasix  use prior to admission, urine Na would not be useful.  - avoid nephrotoxic agents - urine urea, creatinine - PVR - AM BMP Chronic health problem T2DM: hold home glipizide, metformin. glucose 91-166, no SSI. Discontinue CBG checks, A1c pending HTN: restart amlodipine , MAPs >100, but hold doxazosin, lasix , losartan . Will gradually restart for SBP <180, per neuro.  HLD: lipitor as  above Insomnia: hold Gabapentin  1200 mg at bedtime while acutely dizzy   FEN/GI: regular PPx: eliquis   Dispo:Pending PT recommendations  pending clinical improvement .  Subjective:  Overnight had recurrent headaches that responded to medication cocktail. No new neuro deficits. Reports also he had anxiety and did not sleep well which is partially a chronic problem and partially due to staffing interruptions.   Objective: Temp:  [97.6 F (36.4 C)-99 F (37.2 C)] 98.5 F (36.9 C) (12/01 2353) Pulse Rate:  [96-112] 96 (12/01 2353) Resp:  [15-19] 15 (12/01 2353) BP: (133-150)/(78-102) 133/90 (12/01 2353) SpO2:  [93 %-98 %] 95 % (12/01 2353) Weight:  [92.5 kg] 92.5 kg (12/01 1556) Physical Exam: General: well appearing man sitting up in bed Cardiovascular: irregular rhythm, regular rate, 2+ radial pulse Respiratory: no increased WOB Abdomen: soft, nontender Neuro: bilaterally 5/5 UE and LE strength, PERRL, speech is goal oriented and without delay or slow, no pronator dirft, slow but accurate finger to nose testing  Laboratory: Most recent CBC Lab Results  Component Value Date   WBC 6.7 04/27/2024   HGB 13.2 04/27/2024   HCT 38.8 (L) 04/27/2024   MCV 91.9 04/27/2024   PLT 137 (L) 04/27/2024   Most recent BMP    Latest Ref Rng & Units 04/27/2024    2:32 AM  BMP  Glucose 70 - 99 mg/dL 863   BUN 8 - 23 mg/dL 18   Creatinine 9.38 - 1.24 mg/dL 8.45   Sodium 864 - 854 mmol/L 135   Potassium 3.5 - 5.1 mmol/L 3.6   Chloride 98 - 111 mmol/L 100   CO2 22 - 32 mmol/L 27   Calcium  8.9 - 10.3 mg/dL 8.2    HDL 36, LDL 64  Imaging/Diagnostic Tests: CT CHEST ABDOMEN PELVIS W CONTRAST Result Date: 04/26/2024 EXAM: CT CHEST, ABDOMEN AND PELVIS WITH CONTRAST 04/26/2024 12:08:43 PM TECHNIQUE: CT of the chest, abdomen and pelvis was performed with the administration of 75 mL of iohexol  (OMNIPAQUE ) 350 MG/ML intravenous contrast. Multiplanar reformatted images are provided for review.  Automated exposure control, iterative reconstruction, and/or weight based adjustment of the mA/kV was utilized to reduce the radiation dose to as low as reasonably achievable. COMPARISON: Chest CT 02/06/2022 and report from chest CT from 06/24/2022. CLINICAL HISTORY: Vertigo, stroke 1 month ago. Dizziness. Acute cerebellar CVA (Cerebrovascular Accident). FINDINGS: CHEST: MEDIASTINUM AND LYMPH NODES: Heart and pericardium are unremarkable. The central airways are clear. No mediastinal, hilar or axillary lymphadenopathy. LUNGS AND PLEURA: Mild scarring or atelectasis in both lower lobes. 2 x 3 mm right lower lobe nodule on image 112 series 4. No focal consolidation or pulmonary edema. No pleural effusion or pneumothorax. ABDOMEN AND PELVIS: LIVER: The liver is unremarkable. GALLBLADDER AND BILE DUCTS: Gallbladder is unremarkable. No biliary ductal dilatation. SPLEEN: No acute abnormality. PANCREAS: No acute abnormality. ADRENAL GLANDS: 0.9 cm known right adrenal adenoma, image 69 series 3. No further imaging workup of this lesion is indicated. KIDNEYS, URETERS AND BLADDER: No stones in the kidneys or ureters. No hydronephrosis. No perinephric or periureteral stranding. Urinary bladder is unremarkable. GI AND BOWEL: Stomach demonstrates no acute abnormality. Descending and sigmoid colon diverticulosis. There is no bowel obstruction. REPRODUCTIVE ORGANS: No acute abnormality. PERITONEUM AND RETROPERITONEUM: No ascites. No free air. VASCULATURE: Aorta is normal in caliber. Thoracic aortic, coronary artery, and branch vessel atheromatous vascular calcifications. Mild aortoiliac atheromatous vascular calcification. ABDOMINAL AND PELVIS LYMPH NODES: No lymphadenopathy. BONES AND SOFT TISSUES: Bridging spur of the right sacroiliac joint. Mild lower lumbar spondylosis and degenerative disc disease. No focal soft tissue abnormality. IMPRESSION: 1. No acute findings in the chest, abdomen, and pelvis related to vertigo, recent  stroke, or dizziness. No compelling findings of active malignancy. 2. Incidental 2 x 3 mm solid right lower lobe pulmonary nodule without high-risk features; per Fleischner Society Guidelines, no routine follow-up imaging is recommended. 3. Known 0.9 cm right adrenal adenoma; no follow-up imaging is recommended. 4. Mild lower lumbar spondylosis and degenerative disc disease. 5. Bridging spur of the right sacroiliac joint. 6. Atherosclerosis. Electronically signed by: Ryan Salvage MD 04/26/2024 01:33 PM EST RP Workstation: [id number]      Alena Morrison, Shenna Brissette, MD 04/27/2024, 7:52 AM  PGY-1, [facility] FPTS Intern pager: [phone], text pages welcome Secure chat group [facility] Teaching Service

## 2024-04-27 NOTE — Assessment & Plan Note (Addendum)
 Creatinine remains elevated today. Nephrotic syndrome unlikely despite proteinuria given normal albumin, thrombocytopenia. Given lasix  use prior to admission, urine Na would not be useful.  - avoid nephrotoxic agents - urine urea, creatinine - PVR - AM BMP

## 2024-04-27 NOTE — Assessment & Plan Note (Addendum)
 Telemetry revelaed atrial fibrillation but rate control <110 bpm - telemetry - eliquis  as above - continue coreg  12.5 BID - echo as above

## 2024-04-27 NOTE — Plan of Care (Signed)
   Problem: Fluid Volume: Goal: Ability to maintain a balanced intake and output will improve Outcome: Progressing   Problem: Nutritional: Goal: Progress toward achieving an optimal weight will improve Outcome: Progressing   Problem: Skin Integrity: Goal: Risk for impaired skin integrity will decrease Outcome: Progressing   Problem: Tissue Perfusion: Goal: Adequacy of tissue perfusion will improve Outcome: Progressing

## 2024-04-27 NOTE — Assessment & Plan Note (Addendum)
 T2DM: hold home glipizide, metformin. glucose 91-166, no SSI. Discontinue CBG checks, A1c pending HTN: restart amlodipine , MAPs >100, but hold doxazosin, lasix , losartan . Will gradually restart for SBP <180, per neuro.  HLD: lipitor as above Insomnia: hold Gabapentin  1200 mg at bedtime while acutely dizzy

## 2024-04-29 ENCOUNTER — Encounter: Payer: Self-pay | Admitting: Cardiology

## 2024-04-29 ENCOUNTER — Inpatient Hospital Stay: Admitting: Cardiology

## 2024-04-29 ENCOUNTER — Ambulatory Visit: Attending: Family Medicine | Admitting: Speech Pathology

## 2024-04-29 ENCOUNTER — Encounter: Payer: Self-pay | Admitting: Speech Pathology

## 2024-04-29 ENCOUNTER — Other Ambulatory Visit: Payer: Self-pay

## 2024-04-29 VITALS — BP 152/84 | HR 98 | Ht 72.0 in | Wt 206.0 lb

## 2024-04-29 DIAGNOSIS — I4821 Permanent atrial fibrillation: Secondary | ICD-10-CM

## 2024-04-29 DIAGNOSIS — D6869 Other thrombophilia: Secondary | ICD-10-CM

## 2024-04-29 DIAGNOSIS — R4701 Aphasia: Secondary | ICD-10-CM | POA: Insufficient documentation

## 2024-04-29 DIAGNOSIS — I4811 Longstanding persistent atrial fibrillation: Secondary | ICD-10-CM

## 2024-04-29 DIAGNOSIS — R2689 Other abnormalities of gait and mobility: Secondary | ICD-10-CM | POA: Insufficient documentation

## 2024-04-29 DIAGNOSIS — I4819 Other persistent atrial fibrillation: Secondary | ICD-10-CM | POA: Diagnosis not present

## 2024-04-29 DIAGNOSIS — Z8673 Personal history of transient ischemic attack (TIA), and cerebral infarction without residual deficits: Secondary | ICD-10-CM | POA: Diagnosis not present

## 2024-04-29 MED ORDER — AMIODARONE HCL 200 MG PO TABS: ORAL_TABLET | ORAL | 3 refills | Status: DC

## 2024-04-29 NOTE — Therapy (Signed)
 OUTPATIENT SPEECH LANGUAGE PATHOLOGY APHASIA TREATMENT   Patient Name: Peter Robinson MRN: [id number] DOB:[birth year], 68 y.o., male Today's Date: 04/29/2024  PCP: Marsa Miquel Faden, MD REFERRING PROVIDER: Briana Elgin LABOR, MD  END OF SESSION:  End of Session - 04/29/24 0854     Visit Number 5    Date for Recertification  05/22/24    SLP Start Time 0845    SLP Stop Time  0925    SLP Time Calculation (min) 40 min    Activity Tolerance Patient tolerated treatment well          Past Medical History:  Diagnosis Date   Atrial fibrillation (HCC)    Diabetes mellitus without complication (HCC)    Hypertension    Past Surgical History:  Procedure Laterality Date   CATARACT EXTRACTION Bilateral    Patient Active Problem List   Diagnosis Date Noted   AKI (acute kidney injury) 04/27/2024   Chronic health problem 04/27/2024   Stroke (HCC) 04/27/2024   Acute cerebrovascular accident (CVA) of cerebellum (HCC) 04/26/2024   Persistent atrial fibrillation (HCC) 04/26/2024   Cerebrovascular accident (CVA) (HCC) 04/26/2024   Permanent atrial fibrillation (HCC) 03/19/2024   Primary hypertension 03/19/2024   Hyperlipidemia 03/19/2024   Controlled type 2 diabetes mellitus, with long-term current use of insulin  (HCC) 03/19/2024   Acute ischemic stroke (HCC) 03/18/2024   Multifocal pneumonia 02/05/2022    ONSET DATE: Referred on 03/19/24   REFERRING DIAG: P36.587 (ICD-10-CM) - Cerebrovascular accident (CVA) due to embolism of left middle cerebral artery (HCC)   THERAPY DIAG:  Aphasia  Rationale for Evaluation and Treatment: Rehabilitation  SUBJECTIVE:   SUBJECTIVE STATEMENT: Did you know I had another stroke?  Pt accompanied by: self  PERTINENT HISTORY: Per EMR: Giovannie Scerbo is a 68 y.o. male with a history of atrial fibrillation, CAD, diabetes mellitus type 2, hypertension, hyperlipidemia.   PAIN:  Are you having pain? No  FALLS: Has patient fallen in last 6  months?  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  DIAGNOSTIC FINDINGS: Per EMR  MR BRAIN WO CONTRAST  IMPRESSION: 1. Acute left posterior MCA territory infarcts.   Electronically signed by: Gilmore Molt MD 03/19/2024  COGNITION: Overall cognitive status: Within functional limits for tasks assessed; reports no changes   AUDITORY COMPREHENSION: Overall auditory comprehension: Relatively intact; however, did take longer to process more complex sentence structure and required repetition.  YES/NO questions: Appears intact Following directions: 2-step 100% Conversation: Moderately Complex Interfering components:   Effective technique: extra processing time and repetition/stressing words  READING COMPREHENSION: Not tested; next session  03/31/24: Tested @ functional paragraph level: Difficulty with recall and comprehension noted. SLP rec slowing  EXPRESSION: verbal  VERBAL EXPRESSION: Level of generative/spontaneous verbalization: conversation Automatic speech: day of week: intact and month of year: intact  Repetition: Impaired: sentence *LONGER sentences reveal increased phonemic paraphasias Naming: 13/15 *one with phonemic paraphasia self corrected, and one with semantic paraphasia also self corrected  *semantic paraphasia  Pragmatics: Appears intact Sentence Formulation: single word 2/2, 2 words 3/3  Comments: Phonemic and semantic paraphasias throughout assessment and case history. Pt with halting word finding when attempting to generate a sentence.  Interfering components: NA Effective technique: self correcting Non-verbal means of  communication: N/A  WRITTEN EXPRESSION: Dominant hand: right Written expression: Not tested  Tested 11/5: Impaired at generated paragraph level (mild)   MOTOR SPEECH: Overall motor speech: Appears intact Level of impairment: NA Respiration: thoracic breathing Phonation: normal Resonance: WFL Articulation: Appears intact Intelligibility: Intelligible Motor planning: Appears intact  ORAL MOTOR EXAMINATION: Overall status: WFL Comments: NA  STANDARDIZED ASSESSMENTS: Portions of this evaluation were taken from the following standardized tests re: [place]  Test for Differential Diagnosis of Aphasia, [place] Diagnostic Aphasia Examination, Chiropodist, Aphasia Diagnostic Profiles, Reading Comprehension Battery for Aphasia, Burns Brief Inventory of Communication and Cognition, SCATBI, Visiting Nurse Association, and [place].    PATIENT REPORTED OUTCOME MEASURES (PROM):  AIQ: 25                                                                                                                            TREATMENT DATE:   04/29/24: Pt was seen for skilled ST services targeting aphasia. Pt reports noticing symptoms a week before he was in the hospital but did not go until Monday. In the hospital, speech was harder to get out (ataxic dysarthria?) - has resolved. No other noticeable (or reported) changes in speech/cognition. Pt reporting struggling with mental health. He has had thoughts of self-harm, but has no plan. Pt was given resources today, SLP educated on relaxation strategies, and instructed pt to speak with his doctor about options on Wednesday. Pt asked about counseling services as well. Due to time, unable to begin thought organization strategies. To discuss next session. Pt and SLP briefly discussed discharge as pt feels like he is in a good place with his speech. To continue to monitor.   04/15/24: Pt was seen for skilled ST services targeting  aphasia. Pt reports continued struggles with insomnia. 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 Pt reports his partner feels like his communication is improving. Pt reports he has been avoiding fast moving/intellectual conversations. Feels embarrassment about anticipated errors. Completed education on word finding strategies. After further discussion, it appears pt is having more difficulty with thought organization vs word finding. To begin thought organization/expression strategies next session.   04/08/24: Pt was seen for skilled ST services targeting aphasia. Pt reports he doesn't remember any days since he has last been here that were bad days. He reports he has found that if he begins speaking and he is not really paying attention, then he flows a lot better. If he gets nervous, then he struggles a little more. His reading comprehension and typing has improved immensely. Pt reports he has been using the pencil to touch each word. He reports he hasn't had a lot of word finding difficulty because he shuts down a little bit. SLP initiated education on word finding strategies. Pt reports he is most likely to try delay. Pt with noted phonemic paraphasia on the word exercise x3 different trials  of using the word. No other sx noted.   03/31/24: Pt was seen for skilled ST services targeting aphasia. Pt reports he has returned to work and having some trouble with typing/spelling. He reports he is still struggling with word finding, but it is fluctuating. SLP assessed writing and reading comprehension. Pt reports he doesn't have trouble with comprehension, though was noted to ask what or for clarification throughout session. He reports no trouble with hearing. SLP educated on reading strategies re: enlarging font, reducing visual distraction with sheet of paper, touching each word as it is read aloud. Pt is noted to rush through all tasks which contribute to  errors. SLP continues to encourage pt to slow rate of reading, speech (by adding pauses), and writing to reduce errors. To cont with education next session.   PATIENT EDUCATION: Education details: aphasia, language, SLP Role Person educated: Patient Education method: Explanation Education comprehension: verbalized understanding and needs further education   GOALS: Goals reviewed with patient? No  SHORT TERM GOALS: Target date: 04/22/24  Complete language testing, PROM, and update goals Baseline: Goal status: MET  2.  Pt will recall 2 word finding strategies to compensate for anomia  Baseline:  Goal status: MET  3.  Pt will verbalize 2+ reading comprehension strategies  Baseline:  Goal status: MET  4.  Pt will verbalize 2+ writing compensations for spelling  Baseline:  Goal status: MET    LONG TERM GOALS: Target date: 05/22/24  Pt will improve score on PROM Baseline:  Goal status: INITIAL  2.  Pt will utilize anomia compensation during therapy session in instances of anomia Baseline:  Goal status: INITIAL  3.  Pt will demonstrate use of reading comprehension strategies in session Baseline:  Goal status: INITIAL  4.  Pt will report successful use of writing compensations at home/work Baseline:  Goal status: INITIAL   ASSESSMENT:  CLINICAL IMPRESSION: Pt is a 68 yo male who presents to ST OP for evaluation post CVA. Pt endorses majority of difficulty with verbal expression and reading comprehension. He feels he is understanding all auditory information. Pt was assessed using informal language subtests from standardized assessments - see above for results. Further reading and writing assessment warranted. SLP observed phonemic and semantic paraphasias, as well as, halting word finding difficulty when attempting to generate more complex thoughts. Pt was relatively conversational and was able to generate basic sentence structures without much difficulty. Pt with  frustration surrounding difficulty and required cueing to slow down as the self-imposed time pressure resulted in more errors. Pt would like to return to work, but is flexible on time frame. SLP rec skilled ST services to address aphasia..    OBJECTIVE IMPAIRMENTS: include aphasia. These impairments are limiting patient from return to work, managing medications, managing appointments, managing finances, household responsibilities, ADLs/IADLs, and effectively communicating at home and in community. Factors affecting potential to achieve goals and functional outcome are NA. Patient will benefit from skilled SLP services to address above impairments and improve overall function.  REHAB POTENTIAL: Good  PLAN:  SLP FREQUENCY: 1x/week  SLP DURATION: 8 weeks  PLANNED INTERVENTIONS: Language facilitation, Environmental controls, Cueing hierachy, Internal/external aids, Functional tasks, Multimodal communication approach, SLP instruction and feedback, Compensatory strategies, Patient/family education, and [id number] Treatment of speech (30 or 45 min)     [employer], CCC-SLP 04/29/2024, 8:55 AM

## 2024-04-29 NOTE — Patient Instructions (Addendum)
 Medication Instructions:  Your physician has recommended you make the following change in your medication:  1) On December 23rd, 2025: START Amiodarone  - take 2 tablets (400 mg total) TWICE a day for 2 weeks, then  - take 1 tablet (200 mg total) TWICE a day for 2 weeks, then  - take 1 tablet (200 mg total) ONCE a day  *If you need a refill on your cardiac medications before your next appointment, please call your pharmacy*  Lab Work: BMET and CBC Come to the lab at the [facility] ([address] [place], [address]st Floor) between the hours of 8:00 am and 4:30 pm. You do NOT have to be fasting. *please go between 12/15-01-05*  Testing/Procedures: TEE/Cardioversion Your physician has requested that you have a TEE/Cardioversion. During a TEE, sound waves are used to create images of your heart. It provides your doctor with information about the size and shape of your heart and how well your heart's chambers and valves are working. In this test, a transducer is attached to the end of a flexible tube that is guided down you throat and into your esophagus (the tube leading from your mouth to your stomach) to get a more detailed image of your heart. Once the TEE has determined that a blood clot is not present, the cardioversion begins. Electrical Cardioversion uses a jolt of electricity to your heart either through paddles or wired patches attached to your chest. This is a controlled, usually prescheduled, procedure. This procedure is done at the hospital and you are not awake during the procedure. You usually go home the day of the procedure. Please see the instruction sheet given to you today for more information.  Follow-Up: At [facility], you and your health needs are our priority.  As part of our continuing mission to provide you with exceptional heart care, our providers are all part of one team.  This team includes your primary Cardiologist  (physician) and Advanced Practice Providers or APPs (Physician Assistants and Nurse Practitioners) who all work together to provide you with the care you need, when you need it.  Your next appointment:   1/29 at 8:15am  Provider:   Fonda Kitty, MD

## 2024-04-29 NOTE — Progress Notes (Unsigned)
 Electrophysiology Office Note:   Date:  05/02/2024  ID:  Peter Robinson, DOB May 04, 1956, MRN [id number]  Primary Cardiologist: None Electrophysiologist: None      History of Present Illness:   Peter Robinson is a 68 y.o. male with h/o DM2, HTN, HLD, non-obstructive CAD, stroke and atrial fibrillation who is being seen today for evaluation for Watchman device at the request of Dr. Rosemarie.  Discussed the use of AI scribe software for clinical note transcription with the patient, who gave verbal consent to proceed.  History of Present Illness Peter Robinson is a 68 year old male with atrial fibrillation and history of stroke who presents for evaluation of his cardiac condition. He was referred by Dr. Rosemarie, a neurologist, for evaluation of his atrial fibrillation and stroke risk.  He has a history of atrial fibrillation for several years with minimal symptoms. He has not undergone any procedures or been on antiarrhythmic medications due to minimal symptoms.  He has a history of stroke, with the most recent event occurring while on Eliquis , which he had been taking compliantly since October 24th. Prior to this, there was a period of noncompliance with Eliquis , which he resumed after his first stroke. He is now on Pradaxa , started on a Tuesday night, and has been fully compliant since then. No bleeding issues have been reported while on blood thinners.  His past medical history includes a previously noted mitral valve issue. He reports that he was told his mitral valve was an issue in the past, but he is unsure if this was ever significant.  He was seen by a cardiologist in [facility], Dr. Debby Manner, about a year ago, who suggested addressing his atrial fibrillation, but no action was taken at that time. He expresses some frustration about past management, noting he was told he would be in permanent atrial fibrillation.  He is currently taking Pradaxa  and has been compliant with his medication  regimen since his last stroke. No shortness of breath, bleeding issues, or other negative symptoms have been experienced while on his current medication.    Review of systems complete and found to be negative unless listed in HPI.   EP Information / Studies Reviewed:    EKG is not ordered today. EKG from 04/26/24 reviewed which showed AF       Echo 03/19/24:   1. Left ventricular ejection fraction, by estimation, is 50 to 55%. The  left ventricle has low normal function. The left ventricle has no regional  wall motion abnormalities. Left ventricular diastolic function could not  be evaluated.   2. Right ventricular systolic function is normal. The right ventricular  size is normal.   3. Left atrial size was mildly dilated.   4. The mitral valve is normal in structure. Trivial mitral valve  regurgitation. No evidence of mitral stenosis.   5. The aortic valve is tricuspid. There is mild calcification of the  aortic valve. Aortic valve regurgitation is not visualized. Aortic valve  sclerosis/calcification is present, without any evidence of aortic  stenosis.   6. The inferior vena cava is normal in size with greater than 50%  respiratory variability, suggesting right atrial pressure of 3 mmHg.    Risk Assessment/Calculations:    CHA2DS2-VASc Score = 5   This indicates a 7.2% annual risk of stroke. The patient's score is based upon: CHF History: 0 HTN History: 1 Diabetes History: 1 Stroke History: 2 Vascular Disease History: 0 Age Score: 1 Gender Score: 0  Physical Exam:   VS:  BP (!) 152/84 (BP Location: Right Arm, Patient Position: Sitting, Cuff Size: Large)   Pulse 98   Ht 6' (1.829 m)   Wt 206 lb (93.4 kg)   SpO2 94%   BMI 27.94 kg/m    Wt Readings from Last 3 Encounters:  04/29/24 206 lb (93.4 kg)  04/26/24 203 lb 14.8 oz (92.5 kg)  03/19/24 200 lb (90.7 kg)     General: Well developed, in no acute distress.  Neck: No JVD.  Cardiac: Normal rate,  irregular rhythm.  Resp: Normal work of breathing.  Ext: No edema.  Neuro: No gross focal deficits.  Psych: Normal affect.    ASSESSMENT AND PLAN:    #Longstanding persistent atrial fibrillation: Patient states that cardioversion had previously been discussed but was never attempted.  Has been in AF for quite some time.  We discussed that progression to permanent atrial fibrillation correlates with increased risk of stroke, which patient has already had, development of dementia and potential heart failure.  Patient would like to try restoration of sinus rhythm.  We discussed that if we were able to restore sinus rhythm then performing catheter ablation would be reasonable. - Patient recently transition to Pradaxa  after having been on Eliquis .  Will have him take Pradaxa  for 3 weeks then start oral amiodarone  load -400 mg twice daily for 2 weeks, then 200 mg twice daily for 2 weeks then 200 mg once daily.  He will undergo cardioversion after having been on amiodarone  for approximately 2 weeks.  I will then see him back in clinic approximately 2 weeks after cardioversion where we will discuss long-term rhythm control options.  Patient is interested in catheter ablation and if we pursue catheter ablation then I believe concomitant watchman implantation would be appropriate given his history of stroke despite oral anticoagulation.  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 Rosemarie for consideration of Watchman device.  He had a stroke initially in setting of noncompliance with Eliquis  while in AF.  He was started on Eliquis  and had no missed doses for nearly 1 month then presented with a second stroke.  This was deemed Eliquis  failure.  Given stroke despite DOAC, he is being recommended for a Watchman device by his neurologist.  He will continue Pradaxa  for now.  I have seen Peter Robinson in the office today who is being considered for a Watchman left atrial  appendage closure device. I believe they will benefit from this procedure given their history of atrial fibrillation, CHA2DS2-VASc score of 5 and unadjusted ischemic stroke rate of 7.2% per year. 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 The patient's chart has been reviewed and I feel that they would be a candidate for short term oral anticoagulation after Watchman implant.   It is my belief that after undergoing a LAA closure procedure, Upton Russey will not need long term anticoagulation which eliminates anticoagulation side effects and major bleeding risk.   Procedural risks for the Watchman implant have been reviewed with the patient including a 0.5% risk of stroke, <1% risk of perforation and <1% risk of device embolization. Other risks include bleeding, vascular damage, tamponade, worsening renal function, and death. The patient understands these risk and wishes to proceed.     The published clinical data on the safety and effectiveness of WATCHMAN include but are not limited to the following: GLENWOOD Satchel DR,  Jess BEARD, Sick P et al. for the PROTECT AF Investigators. Percutaneous closure of the left atrial appendage versus warfarin therapy for prevention of stroke in patients with atrial fibrillation: a randomised non-inferiority trial. Lancet 2009; 374: 534-42. GLENWOOD Jess BEARD, Doshi SK, Jonita VEAR Satchel D et al. on behalf of the PROTECT AF Investigators. Percutaneous Left Atrial Appendage Closure for Stroke Prophylaxis in Patients With Atrial Fibrillation 2.3-Year Follow-up of the PROTECT AF (Watchman Left Atrial Appendage System for Embolic Protection in Patients With Atrial Fibrillation) Trial. Circulation 2013; 127:720-729. - Alli O, Doshi S,  Kar S, Reddy VY, Sievert H et al. Quality of Life Assessment in the Randomized PROTECT AF (Percutaneous Closure of the Left Atrial Appendage Versus Warfarin Therapy for Prevention of Stroke in  Patients With Atrial Fibrillation) Trial of Patients at Risk for Stroke With Nonvalvular Atrial Fibrillation. J Am Coll Cardiol 2013; 61:1790-8. GLENWOOD Satchel DR, Archer RAMAN, Price M, Whisenant B, Sievert H, Doshi S, Huber K, Reddy V. Prospective randomized evaluation of the Watchman left atrial appendage Device in patients with atrial fibrillation versus long-term warfarin therapy; the PREVAIL trial. Journal of the [employer] of Cardiology, Vol. 4, No. 1, 2014, 1-11. - Kar S, Doshi SK, Sadhu A, Horton R, Osorio J et al. Primary outcome evaluation of a next-generation left atrial appendage closure device: results from the PINNACLE FLX trial. Circulation 2021;143(18)1754-1762.    After today's visit with the patient which was dedicated solely for shared decision making visit regarding LAA closure device, the patient decided to proceed with the LAA appendage closure procedure scheduled to be done in the near future at [facility]. Prior to the procedure, I would like to obtain a gated CT scan of the chest with contrast timed for PV/LA visualization.   HAS-BLED score 3 Hypertension Yes  Abnormal renal and liver function (Dialysis, transplant, Cr >2.26 mg/dL /Cirrhosis or Bilirubin >2x Normal or AST/ALT/AP >3x Normal) No  Stroke Yes  Bleeding No  Labile INR (Unstable/high INR) No  Elderly (>65) Yes  Drugs or alcohol (>= 8 drinks/week, anti-plt or NSAID) No   CHA2DS2-VASc Score = 5  The patient's score is based upon: CHF History: 0 HTN History: 1 Diabetes History: 1 Stroke History: 2 Vascular Disease History: 0 Age Score: 1 Gender Score: 0       Follow up with Dr. Kennyth 2 weeks after DCCV.   Signed, Fonda Kennyth, MD

## 2024-05-03 ENCOUNTER — Other Ambulatory Visit (HOSPITAL_COMMUNITY): Payer: Self-pay

## 2024-05-05 ENCOUNTER — Ambulatory Visit: Admitting: Speech Pathology

## 2024-05-10 ENCOUNTER — Other Ambulatory Visit (HOSPITAL_COMMUNITY): Payer: Self-pay

## 2024-05-12 ENCOUNTER — Ambulatory Visit: Admitting: Speech Pathology

## 2024-05-14 ENCOUNTER — Encounter: Payer: Self-pay | Admitting: Speech Pathology

## 2024-05-14 ENCOUNTER — Ambulatory Visit: Admitting: Speech Pathology

## 2024-05-14 DIAGNOSIS — R4701 Aphasia: Secondary | ICD-10-CM

## 2024-05-14 NOTE — Therapy (Signed)
 " OUTPATIENT SPEECH LANGUAGE PATHOLOGY APHASIA TREATMENT & DISCHARGE SUMMARY   Patient Name: Peter Robinson MRN: [id number] DOB:06-09-1955, 68 y.o., male Today's Date: 05/14/2024  PCP: Marsa Miquel Faden, MD REFERRING PROVIDER: Briana Elgin LABOR, MD  SPEECH THERAPY DISCHARGE SUMMARY  Visits from Start of Care: 6  Current functional level related to goals / functional outcomes: Met all goals   Remaining deficits: Mild anomia/thought organization   Education / Equipment: completed   Patient agrees to discharge. Patient goals were met. Patient is being discharged due to meeting the stated rehab goals..     END OF SESSION:  End of Session - 05/14/24 1020     Visit Number 6    Date for Recertification  05/22/24    SLP Start Time 1018    SLP Stop Time  1058    SLP Time Calculation (min) 40 min    Activity Tolerance Patient tolerated treatment well          Past Medical History:  Diagnosis Date   Atrial fibrillation (HCC)    Diabetes mellitus without complication (HCC)    Hypertension    Past Surgical History:  Procedure Laterality Date   CATARACT EXTRACTION Bilateral    Patient Active Problem List   Diagnosis Date Noted   AKI (acute kidney injury) 04/27/2024   Chronic health problem 04/27/2024   Stroke (HCC) 04/27/2024   Acute cerebrovascular accident (CVA) of cerebellum (HCC) 04/26/2024   Persistent atrial fibrillation (HCC) 04/26/2024   Cerebrovascular accident (CVA) (HCC) 04/26/2024   Permanent atrial fibrillation (HCC) 03/19/2024   Primary hypertension 03/19/2024   Hyperlipidemia 03/19/2024   Controlled type 2 diabetes mellitus, with long-term current use of insulin  (HCC) 03/19/2024   Acute ischemic stroke (HCC) 03/18/2024   Multifocal pneumonia 02/05/2022    ONSET DATE: Referred on 03/19/24   REFERRING DIAG: P36.587 (ICD-10-CM) - Cerebrovascular accident (CVA) due to embolism of left middle cerebral artery (HCC)   THERAPY DIAG:   Aphasia  Rationale for Evaluation and Treatment: Rehabilitation  SUBJECTIVE:   SUBJECTIVE STATEMENT: I just want to be back to where I was.  Pt accompanied by: self  PERTINENT HISTORY: Per EMR: Peter Robinson is a 68 y.o. male with a history of atrial fibrillation, CAD, diabetes mellitus type 2, hypertension, hyperlipidemia.   PAIN:  Are you having pain? No  FALLS: Has patient fallen in last 6 months?  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  DIAGNOSTIC FINDINGS: Per EMR  MR BRAIN WO CONTRAST  IMPRESSION: 1. Acute left posterior MCA territory infarcts.   Electronically signed by: Gilmore Molt MD 03/19/2024  COGNITION: Overall cognitive status: Within functional limits for tasks assessed; reports no changes   AUDITORY COMPREHENSION: Overall auditory comprehension: Relatively intact; however, did take longer to process more complex sentence structure and required repetition.  YES/NO questions: Appears intact Following directions: 2-step 100% Conversation: Moderately Complex Interfering components:   Effective technique: extra processing time and repetition/stressing words  READING COMPREHENSION: Not tested; next session  03/31/24: Tested @ functional paragraph level: Difficulty with recall and comprehension noted. SLP rec slowing  EXPRESSION: verbal  VERBAL EXPRESSION: Level of generative/spontaneous verbalization: conversation Automatic speech: day of week: intact and month of year: intact  Repetition: Impaired: sentence *LONGER  sentences reveal increased phonemic paraphasias Naming: 13/15 *one with phonemic paraphasia self  corrected, and one with semantic paraphasia also self corrected  *semantic paraphasia  Pragmatics: Appears intact Sentence Formulation: single word 2/2, 2 words 3/3  Comments: Phonemic and semantic paraphasias throughout assessment and case history. Pt with halting word finding when attempting to generate a sentence.  Interfering components: NA Effective technique: self correcting Non-verbal means of communication: N/A  WRITTEN EXPRESSION: Dominant hand: right Written expression: Not tested  Tested 11/5: Impaired at generated paragraph level (mild)   MOTOR SPEECH: Overall motor speech: Appears intact Level of impairment: NA Respiration: thoracic breathing Phonation: normal Resonance: WFL Articulation: Appears intact Intelligibility: Intelligible Motor planning: Appears intact  ORAL MOTOR EXAMINATION: Overall status: WFL Comments: NA  STANDARDIZED ASSESSMENTS: Portions of this evaluation were taken from the following standardized tests re: [place]  Test for Differential Diagnosis of Aphasia, [place] Diagnostic Aphasia Examination, Chiropodist, Aphasia Diagnostic Profiles, Reading Comprehension Battery for Aphasia, Burns Brief Inventory of Communication and Cognition, SCATBI, Visiting Nurse Association, and [place].    PATIENT REPORTED OUTCOME MEASURES (PROM):  AIQ: 25                                                                                                                            TREATMENT DATE:   05/14/24: Pt was seen for skilled ST services targeting aphasia. Pt reports increased stress and poor sleep. Continues to struggle with anxiety. SLP retierated the importance of following up with provider. Pt reports variability in language. SLP suspects this is due to fatigue (3-4 hrs of sleep per night) and the amount of anxiety/worry he is experiencing. He does not exhibit any notable difficulties in this or previous session. SLP  provided pt with thought organization strategies. Discharging today. He verbalized how he can return to therapy - seek referral and schedule eval for SLP if he feels he continues to struggle.   04/29/24: Pt was seen for skilled ST services targeting aphasia. Pt reports noticing symptoms a week before he was in the hospital but did not go until Monday. In the hospital, speech was harder to get out (ataxic dysarthria?) - has resolved. No other noticeable (or reported) changes in speech/cognition. Pt reporting struggling with mental health. He has had thoughts of self-harm, but has no plan. Pt was given resources today, SLP educated on relaxation strategies, and instructed pt to speak with his doctor about options on Wednesday. Pt asked about counseling services as well. Due to time, unable to begin thought organization strategies. To discuss next session. Pt and SLP briefly discussed discharge as pt feels like he is in a good place with his speech. To continue to monitor.   04/15/24: Pt was seen for skilled ST services targeting aphasia. Pt reports continued struggles with insomnia. 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 Pt reports his partner feels like his communication  is improving. Pt reports he has been avoiding fast moving/intellectual conversations. Feels embarrassment about anticipated errors. Completed education on word finding strategies. After further discussion, it appears pt is having more difficulty with thought organization vs word finding. To begin thought organization/expression strategies next session.   04/08/24: Pt was seen for skilled ST services targeting aphasia. Pt reports he doesn't remember any days since he has last been here that were bad days. He reports he has found that if he begins speaking and he is not really paying attention, then he flows a lot better. If he gets nervous, then he struggles a little more. His reading  comprehension and typing has improved immensely. Pt reports he has been using the pencil to touch each word. He reports he hasn't had a lot of word finding difficulty because he shuts down a little bit. SLP initiated education on word finding strategies. Pt reports he is most likely to try delay. Pt with noted phonemic paraphasia on the word exercise x3 different trials of using the word. No other sx noted.   03/31/24: Pt was seen for skilled ST services targeting aphasia. Pt reports he has returned to work and having some trouble with typing/spelling. He reports he is still struggling with word finding, but it is fluctuating. SLP assessed writing and reading comprehension. Pt reports he doesn't have trouble with comprehension, though was noted to ask what or for clarification throughout session. He reports no trouble with hearing. SLP educated on reading strategies re: enlarging font, reducing visual distraction with sheet of paper, touching each word as it is read aloud. Pt is noted to rush through all tasks which contribute to errors. SLP continues to encourage pt to slow rate of reading, speech (by adding pauses), and writing to reduce errors. To cont with education next session.   PATIENT EDUCATION: Education details: aphasia, language, SLP Role Person educated: Patient Education method: Explanation Education comprehension: verbalized understanding and needs further education   GOALS: Goals reviewed with patient? No  SHORT TERM GOALS: Target date: 04/22/24  Complete language testing, PROM, and update goals Baseline: Goal status: MET  2.  Pt will recall 2 word finding strategies to compensate for anomia  Baseline:  Goal status: MET  3.  Pt will verbalize 2+ reading comprehension strategies  Baseline:  Goal status: MET  4.  Pt will verbalize 2+ writing compensations for spelling  Baseline:  Goal status: MET    LONG TERM GOALS: Target date: 05/22/24  Pt will improve score  on PROM Baseline:  Goal status: NOT MET; out of time to complete today  2.  Pt will utilize anomia compensation during therapy session in instances of anomia Baseline:  Goal status: MET  3.  Pt will demonstrate use of reading comprehension strategies in session Baseline:  Goal status: MET  4.  Pt will report successful use of writing compensations at home/work Baseline:  Goal status: MET   ASSESSMENT:  CLINICAL IMPRESSION: Pt is a 68 yo male who presented to ST OP for evaluation post CVA. Both in agreement with d/c. Pt has made significant improvement with therapy. He continues to have days where he struggles more than others, though SLP suspects this is due to fatigue and mental health. He understands he can return to therapy at any time with referrral from physician.   @ eval: Pt endorses majority of difficulty with verbal expression and reading comprehension. He feels he is understanding all auditory information. Pt was assessed using informal language subtests from  standardized assessments - see above for results. Further reading and writing assessment warranted. SLP observed phonemic and semantic paraphasias, as well as, halting word finding difficulty when attempting to generate more complex thoughts. Pt was relatively conversational and was able to generate basic sentence structures without much difficulty. Pt with frustration surrounding difficulty and required cueing to slow down as the self-imposed time pressure resulted in more errors. Pt would like to return to work, but is flexible on time frame. SLP rec skilled ST services to address aphasia..    OBJECTIVE IMPAIRMENTS: include aphasia. These impairments are limiting patient from return to work, managing medications, managing appointments, managing finances, household responsibilities, ADLs/IADLs, and effectively communicating at home and in community. Factors affecting potential to achieve goals and functional outcome are NA.  Patient will benefit from skilled SLP services to address above impairments and improve overall function.  REHAB POTENTIAL: Good  PLAN:  SLP FREQUENCY: 1x/week  SLP DURATION: 8 weeks  PLANNED INTERVENTIONS: Language facilitation, Environmental controls, Cueing hierachy, Internal/external aids, Functional tasks, Multimodal communication approach, SLP instruction and feedback, Compensatory strategies, Patient/family education, and [id number] Treatment of speech (30 or 45 min)     [employer], CCC-SLP 05/14/2024, 10:22 AM      "

## 2024-05-17 NOTE — Progress Notes (Unsigned)
 " [facility] [address] [place]. [place] [address] [phone]       HOSPITAL FOLLOW UP NOTE  [name] Date of Birth: 1955-06-12 Medical Record Number: [id number]   Reason for Referral:  hospital stroke follow up    SUBJECTIVE:   CHIEF COMPLAINT:  No chief complaint on file.   HPI:   Peter Robinson is a 68 y.o. who  has a past medical history of Atrial fibrillation (HCC), Diabetes mellitus without complication (HCC), and Hypertension.  Patient presented on 10823/2025 with speech and word finding difficulty. He had not taken Eliquis  for atrial fib. MRI brain showed acute left posterior MCA territory infarcts. 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 LDL of 93. Hemoglobin A1C of 6.6%. Transthoracic Echocardiogram significant for no atrial level shunt. Neurology recommendations for Eliquis  and to continue Lipitor. PT/OT recommendations for no follow-up. SLP recommendation for outpatient speech therapy. Personally reviewed hospitalization pertinent progress notes, lab work and imaging.  Evaluated by Dr Rosemarie.   Since discharge,   ST  BP? He continues carvedilol , furosemide , amlodipine  and losartan . He continues atorvastatin  and tolerating it well. A1C well managed on metformin, Lantus and glipizide.   He has a history of sleep apnea. He has not used CPAP, recently    PERTINENT IMAGING/LABS  CTA head & neck occlusion of small proximal M3 branch of the left MCA.  Mild stenosis of the right paraclinoid ICA.  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  Mild chronic microvascular ischemic changes. MRI acute left posterior MCA territory infarcts 2D Echo 50-55%   A1C Lab Results  Component Value Date   HGBA1C 7.3 (H) 04/26/2024    Lipid Panel     Component  Value Date/Time   CHOL 124 04/26/2024 1816   TRIG 120 04/26/2024 1816   HDL 36 (L) 04/26/2024 1816   CHOLHDL 3.4 04/26/2024 1816   VLDL 24 04/26/2024 1816   LDLCALC 64 04/26/2024 1816      ROS:   14 system review of systems performed and negative with exception of those listed in HPI  PMH:  Past Medical History:  Diagnosis Date   Atrial fibrillation (HCC)    Diabetes mellitus without complication (HCC)    Hypertension     PSH:  Past Surgical History:  Procedure Laterality Date   CATARACT EXTRACTION Bilateral     Social History:  Social History   Socioeconomic History   Marital status: Single    Spouse name: Not on file   Number of children: Not on file   Years of education: Not on file   Highest education level: Not on file  Occupational History   Not on file  Tobacco Use   Smoking status: Former    Types: Cigarettes   Smokeless tobacco: Never  Vaping Use   Vaping status: Never Used  Substance and Sexual Activity   Alcohol use: Yes    Comment: occ   Drug use: Never   Sexual activity: Not on file  Other Topics Concern   Not on file  Social History Narrative   Not on file   Social Drivers of Health   Tobacco Use: Medium Risk (05/14/2024)   Patient History    Smoking Tobacco Use: Former    Smokeless Tobacco Use: Never    Passive Exposure: Not  on file  Financial Resource Strain: Not on file  Food Insecurity: No Food Insecurity (04/26/2024)   Epic    Worried About Programme Researcher, Broadcasting/film/video in the Last Year: Never true    Ran Out of Food in the Last Year: Never true  Transportation Needs: No Transportation Needs (04/26/2024)   Epic    Lack of Transportation (Medical): No    Lack of Transportation (Non-Medical): No  Physical Activity: Not on file  Stress: No Stress Concern Present (08/08/2021)   Received from [facility] - Occupational Stress Questionnaire    Feeling of Stress : Not at all  Social Connections:  Socially Isolated (04/26/2024)   Social Connection and Isolation Panel    Frequency of Communication with Friends and Family: Twice a week    Frequency of Social Gatherings with Friends and Family: Twice a week    Attends Religious Services: Never    Database Administrator or Organizations: No    Attends Banker Meetings: Never    Marital Status: Divorced  Catering Manager Violence: Not At Risk (04/26/2024)   Epic    Fear of Current or Ex-Partner: No    Emotionally Abused: No    Physically Abused: No    Sexually Abused: No  Depression (PHQ2-9): Not on file  Alcohol Screen: Not on file  Housing: Low Risk (04/26/2024)   Epic    Unable to Pay for Housing in the Last Year: No    Number of Times Moved in the Last Year: 0    Homeless in the Last Year: No  Utilities: Not At Risk (04/26/2024)   Epic    Threatened with loss of utilities: No  Health Literacy: Not on file    Family History: No family history on file.  Medications:   Current Outpatient Medications on File Prior to Visit  Medication Sig Dispense Refill   [START ON 05/18/2024] amiodarone  (PACERONE ) 200 MG tablet Take 2 tablets (400 mg total) by mouth 2 (two) times daily for 14 days, THEN 1 tablet (200 mg total) 2 (two) times daily for 14 days, THEN 1 tablet (200 mg total) daily. 110 tablet 3   amLODipine  (NORVASC ) 2.5 MG tablet Take 2.5 mg by mouth daily.     atorvastatin  (LIPITOR) 40 MG tablet Take 40 mg by mouth daily.     carvedilol  (COREG ) 12.5 MG tablet Take 12.5 mg by mouth 2 (two) times daily.     dabigatran  (PRADAXA ) 150 MG CAPS capsule Take 1 capsule (150 mg total) by mouth 2 (two) times daily. 180 capsule 0   [Paused] diclofenac (VOLTAREN) 75 MG EC tablet Take 75 mg by mouth 2 (two) times daily as needed for mild pain (pain score 1-3) (playing golf).     doxazosin (CARDURA) 2 MG tablet Take 2 mg by mouth daily.     [Paused] furosemide  (LASIX ) 20 MG tablet Take 20 mg by mouth daily.     gabapentin   (NEURONTIN ) 600 MG tablet Take 1,200 mg by mouth at bedtime.     glipiZIDE (GLUCOTROL XL) 5 MG 24 hr tablet Take 5 mg by mouth daily with breakfast.     [Paused] LANTUS SOLOSTAR 100 UNIT/ML Solostar Pen Inject 20-25 Units into the skin daily.     [Paused] losartan  (COZAAR ) 100 MG tablet Take 100 mg by mouth daily.     metFORMIN (GLUCOPHAGE-XR) 500 MG 24 hr tablet Take 500 mg by mouth daily.     [Paused]  predniSONE (DELTASONE) 20 MG tablet Take 20 mg by mouth daily as needed (inflammation).     No current facility-administered medications on file prior to visit.    Allergies:  No Known Allergies    OBJECTIVE:  Physical Exam  There were no vitals filed for this visit. There is no height or weight on file to calculate BMI. No results found.      No data to display           General: well developed, well nourished, seated, in no evident distress Head: head normocephalic and atraumatic.   Neck: supple with no carotid or supraclavicular bruits Cardiovascular: regular rate and rhythm, no murmurs Musculoskeletal: no deformity Skin:  no rash/petichiae Vascular:  Normal pulses all extremities   Neurologic Exam Mental Status: Awake and fully alert.  Fluent speech and language.  Oriented to place and time. Recent and remote memory intact. Attention span, concentration and fund of knowledge appropriate. Mood and affect appropriate.  Cranial Nerves: Fundoscopic exam reveals sharp disc margins. Pupils equal, briskly reactive to light. Extraocular movements full without nystagmus. Visual fields full to confrontation. Hearing intact. Facial sensation intact. Face, tongue, palate moves normally and symmetrically.  Motor: Normal bulk and tone. Normal strength in all tested extremity muscles Sensory.: intact to touch , pinprick , position and vibratory sensation.  Coordination: Rapid alternating movements normal in all extremities. Finger-to-nose and heel-to-shin performed accurately  bilaterally. Gait and Station: Arises from chair without difficulty. Stance is normal. Gait demonstrates normal stride length and balance with ***. Tandem walk and heel toe ***.  Reflexes: 1+ and symmetric.    NIHSS  *** Modified Rankin  ***    ASSESSMENT: Peter Robinson is a 68 y.o. year old male presenting with speech and word finding difficulty. Vascular risk factors include afib, nonobstructive CAD, DMT2, HTN, HLD.      PLAN:  Acute Ischemic Infarct:  left posterior MCA territory infarcts. Etiology: Atrial fibrillation not on anticoagulation (patient self discontinued Eliquis ): Residual deficit: ***. Continue Eliquis  (apixaban ) daily and atorvastatin  40mg  daily for secondary stroke prevention. Discussed secondary stroke prevention measures and importance of close PCP follow up for aggressive stroke risk factor management. I have gone over the pathophysiology of stroke, warning signs and symptoms, risk factors and their management in some detail with instructions to go to the closest emergency room for symptoms of concern. HTN: BP goal <130/90.  Stable on carvedilol , furosemide , amlodipine  and losartan . Continue per PCP HLD: LDL goal <70. Recent LDL 93. Continue atorvastatin  40mg  daily per PCP.  DMII: A1c goal<7.0. Recent A1c 6.6. At goal. Continue metformin, Lantus and glipizide.  Sleep apnea:    Follow up in *** or call earlier if needed   CC:  [facility] provider: Dr. Rosemarie PCP: Marsa Miquel Faden, MD    I spent *** minutes of face-to-face and non-face-to-face time with patient.  This included previsit chart review including review of recent hospitalization, lab review, study review, order entry, electronic health record documentation, patient education regarding recent stroke including etiology, secondary stroke prevention measures and importance of managing stroke risk factors, residual deficits and typical recovery time and answered all other questions to patient  satisfaction   Greig Forbes, [facility]  [facility] [address] [place], [place] [address]  Phone [phone] Fax [phone] Note: This document was prepared with digital dictation and possible smart phrase technology. Any transcriptional errors that result from this process are unintentional.       "

## 2024-05-17 NOTE — Patient Instructions (Incomplete)

## 2024-05-18 ENCOUNTER — Encounter: Payer: Self-pay | Admitting: Family Medicine

## 2024-05-18 ENCOUNTER — Inpatient Hospital Stay: Admitting: Family Medicine

## 2024-05-18 VITALS — BP 162/101 | HR 95 | Ht 72.0 in | Wt 210.6 lb

## 2024-05-18 DIAGNOSIS — I63443 Cerebral infarction due to embolism of bilateral cerebellar arteries: Secondary | ICD-10-CM

## 2024-05-18 DIAGNOSIS — Z794 Long term (current) use of insulin: Secondary | ICD-10-CM

## 2024-05-18 DIAGNOSIS — I4819 Other persistent atrial fibrillation: Secondary | ICD-10-CM | POA: Diagnosis not present

## 2024-05-18 DIAGNOSIS — E785 Hyperlipidemia, unspecified: Secondary | ICD-10-CM | POA: Diagnosis not present

## 2024-05-18 DIAGNOSIS — E119 Type 2 diabetes mellitus without complications: Secondary | ICD-10-CM

## 2024-05-21 ENCOUNTER — Other Ambulatory Visit: Payer: Self-pay

## 2024-05-25 ENCOUNTER — Other Ambulatory Visit: Payer: Self-pay

## 2024-05-25 ENCOUNTER — Encounter: Payer: Self-pay | Admitting: Physical Therapy

## 2024-05-25 ENCOUNTER — Ambulatory Visit: Admitting: Physical Therapy

## 2024-05-25 DIAGNOSIS — R4701 Aphasia: Secondary | ICD-10-CM | POA: Diagnosis not present

## 2024-05-25 DIAGNOSIS — R2689 Other abnormalities of gait and mobility: Secondary | ICD-10-CM

## 2024-05-25 NOTE — Therapy (Signed)
 " OUTPATIENT PHYSICAL THERAPY NEURO EVALUATION   Patient Name: Peter Robinson MRN: [id number] DOB:1956-02-19, 68 y.o., male Today's Date: 05/25/2024   PCP: Miquel Curtistine Blunt, MD REFERRING PROVIDER: Miquel Curtistine Blunt, MD  END OF SESSION:  PT End of Session - 05/25/24 1151     Visit Number 1    Number of Visits 1    Authorization Type BCBS medicare    PT Start Time 0930    PT Stop Time 1024    PT Time Calculation (min) 54 min    Activity Tolerance Patient tolerated treatment well    Behavior During Therapy [facility] for tasks assessed/performed          Past Medical History:  Diagnosis Date   Atrial fibrillation (HCC)    Diabetes mellitus without complication (HCC)    Hypertension    Past Surgical History:  Procedure Laterality Date   CATARACT EXTRACTION Bilateral    Patient Active Problem List   Diagnosis Date Noted   AKI (acute kidney injury) 04/27/2024   Chronic health problem 04/27/2024   Stroke (HCC) 04/27/2024   Acute cerebrovascular accident (CVA) of cerebellum (HCC) 04/26/2024   Persistent atrial fibrillation (HCC) 04/26/2024   Cerebrovascular accident (CVA) (HCC) 04/26/2024   Permanent atrial fibrillation (HCC) 03/19/2024   Primary hypertension 03/19/2024   Hyperlipidemia 03/19/2024   Controlled type 2 diabetes mellitus, with long-term current use of insulin  (HCC) 03/19/2024   Acute ischemic stroke (HCC) 03/18/2024   Multifocal pneumonia 02/05/2022    ONSET DATE: 04/26/24  REFERRING DIAG: CVA   THERAPY DIAG:  Other abnormalities of gait and mobility  Rationale for Evaluation and Treatment: Rehabilitation  SUBJECTIVE:                                                                                                                                                                                             SUBJECTIVE STATEMENT: Pt states that he has been overall improving since hospital dc and has noted dec frequency of days where balance is  impaired. Pt reports an episode of dec balance and difficulty walking yesterday. Reports episodes of instability are variable and inconsistent with specific tasks or activity or positions. Pt would like to see if he can address balance and instability with skilled PT vs home management.  Pt accompanied by: self  PERTINENT HISTORY: Pt has prior hx of CVA and was admitted 04/26/24-04/27/24 for CVA. Saw Neurology last week, will be completing cardioversion in January.   PAIN:  Are you having pain? No  PRECAUTIONS: None  RED FLAGS: None   WEIGHT BEARING RESTRICTIONS: No  FALLS: Has patient fallen in last 6  months? No  LIVING ENVIRONMENT: Lives with: lives alone Lives in: House/apartment Stairs: Yes: Internal: 13 steps; on left going up Has following equipment at home: None  PLOF: Independent  PATIENT GOALS: improve balance and return to walking  OCCUPATION: civil public relations account executive work, desk work  OBJECTIVE:  Note: Objective measures were completed at Evaluation unless otherwise noted.  DIAGNOSTIC FINDINGS: see imaging section in chart  COGNITION: Overall cognitive status: Within functional limits for tasks assessed   SENSATION: WFL  COORDINATION: Intact bilaterally in UE and LE heel slide and finger nose   EDEMA:  None noted at eval  MUSCLE TONE: tone WNL bilaterally   VESTIBULAR TESTING:  Smooth persuit, saccades, convergence WFL and without subjective report of symptoms DGI 22/24 BERG 51/56   POSTURE: No Significant postural limitations  LOWER EXTREMITY ROM:   WFL   Active  Right Eval Left Eval  Hip flexion    Hip extension    Hip abduction    Hip adduction    Hip internal rotation    Hip external rotation    Knee flexion    Knee extension    Ankle dorsiflexion    Ankle plantarflexion    Ankle inversion    Ankle eversion     (Blank rows = not tested)  LOWER EXTREMITY MMT:  WNL BLE   MMT Right Eval Left Eval  Hip flexion    Hip extension    Hip  abduction    Hip adduction    Hip internal rotation    Hip external rotation    Knee flexion    Knee extension    Ankle dorsiflexion    Ankle plantarflexion    Ankle inversion    Ankle eversion    (Blank rows = not tested)                                                                                                                               TREATMENT DATE:   05/25/24- EVAL     PATIENT EDUCATION: Education details: Pt educated on relevant anatomy, physiology, pathology, diagnosis, prognosis, progression of care, pain and activity modification related to CVA and return to PLOF Person educated: Patient Education method: Explanation, Demonstration, and Handouts Education comprehension: verbalized understanding and returned demonstration  HOME EXERCISE PROGRAM: Access Code: [email or web address] URL: [email or web address] Date: 05/25/2024 Prepared by: Stann Ohara  Exercises - Standing Knee Flexion  - 1 x daily - 4 x weekly - 1 sets - 10 reps - 5-10 hold - LE D1 Flexion in Standing  - 1 x daily - 4 x weekly - 3 sets - 10 reps - 2 hold - Standing 3-way Hip with Walker  - 1 x daily - 4 x weekly - 1 sets - 10 reps - 2 hold  GOALS: Goals reviewed with patient? No  SHORT TERM GOALS: N/A - EVAL ONLY   ASSESSMENT:  CLINICAL IMPRESSION: Patient is a 68 y.o. M who was seen today  for physical therapy evaluation and treatment for s/p CVA with hospital admission 04/26/24-04/27/24. Pt has been progressing well since hospital dc, is impulsive with movements and tries to accomplish tasks quickly. We discussed improving safety awareness, and the risk of falls with continued quick movements and pacing to allow for improved neuromuscular coordination with functional movements during dynamic situations. Pt verbalizes understanding. Negative vestibular screening. Provided HEP to continue higher level balance training with support available. Pt does not require continued skilled physical  therapy at the time of this eval, should needs arise, pt encouraged to notify MD and have referral sent based on concerns. Pt in agreement with plan. Evaluation only.    OBJECTIVE IMPAIRMENTS: decreased balance, decreased knowledge of condition, and decreased safety awareness.   ACTIVITY LIMITATIONS: none noted this day  PARTICIPATION LIMITATIONS: pt at baseline PLOF   PERSONAL FACTORS: Behavior pattern are also affecting patient's functional outcome.   REHAB POTENTIAL: Excellent  CLINICAL DECISION MAKING: Stable/uncomplicated  EVALUATION COMPLEXITY: Low  PLAN:  PT FREQUENCY: one time visit  PT DURATION: 1 sessions  PLANNED INTERVENTIONS: N/A  PLAN FOR NEXT SESSION: N/A   Stann DELENA Ohara, PT 05/25/2024, 11:55 AM        "

## 2024-05-27 ENCOUNTER — Ambulatory Visit: Payer: Self-pay | Admitting: Cardiology

## 2024-05-27 LAB — CBC
Hematocrit: 46.6 % (ref 37.5–51.0)
Hemoglobin: 15 g/dL (ref 13.0–17.7)
MCH: 30.8 pg (ref 26.6–33.0)
MCHC: 32.2 g/dL (ref 31.5–35.7)
MCV: 96 fL (ref 79–97)
Platelets: 225 x10E3/uL (ref 150–450)
RBC: 4.87 x10E6/uL (ref 4.14–5.80)
RDW: 13.1 % (ref 11.6–15.4)
WBC: 6.3 x10E3/uL (ref 3.4–10.8)

## 2024-05-27 LAB — BASIC METABOLIC PANEL WITH GFR
BUN/Creatinine Ratio: 10 (ref 10–24)
BUN: 15 mg/dL (ref 8–27)
CO2: 27 mmol/L (ref 20–29)
Calcium: 9.2 mg/dL (ref 8.6–10.2)
Chloride: 100 mmol/L (ref 96–106)
Creatinine, Ser: 1.52 mg/dL — ABNORMAL HIGH (ref 0.76–1.27)
Glucose: 143 mg/dL — ABNORMAL HIGH (ref 70–99)
Potassium: 3.9 mmol/L (ref 3.5–5.2)
Sodium: 142 mmol/L (ref 134–144)
eGFR: 49 mL/min/1.73 — ABNORMAL LOW

## 2024-06-04 ENCOUNTER — Ambulatory Visit (HOSPITAL_COMMUNITY)
Admission: RE | Admit: 2024-06-04 | Discharge: 2024-06-04 | Disposition: A | Discharge status: Home / Self Care | Attending: Cardiovascular Disease | Admitting: Cardiovascular Disease

## 2024-06-04 ENCOUNTER — Encounter (HOSPITAL_COMMUNITY): Payer: Self-pay | Admitting: Cardiovascular Disease

## 2024-06-04 ENCOUNTER — Ambulatory Visit (HOSPITAL_COMMUNITY): Admitting: Anesthesiology

## 2024-06-04 ENCOUNTER — Encounter (HOSPITAL_COMMUNITY)
Admission: RE | Disposition: A | Discharge status: Home / Self Care | Payer: Self-pay | Source: Home / Self Care | Attending: Cardiovascular Disease

## 2024-06-04 ENCOUNTER — Other Ambulatory Visit: Payer: Self-pay

## 2024-06-04 DIAGNOSIS — Z7901 Long term (current) use of anticoagulants: Secondary | ICD-10-CM | POA: Insufficient documentation

## 2024-06-04 DIAGNOSIS — Z8673 Personal history of transient ischemic attack (TIA), and cerebral infarction without residual deficits: Secondary | ICD-10-CM | POA: Diagnosis not present

## 2024-06-04 DIAGNOSIS — E119 Type 2 diabetes mellitus without complications: Secondary | ICD-10-CM | POA: Insufficient documentation

## 2024-06-04 DIAGNOSIS — D6869 Other thrombophilia: Secondary | ICD-10-CM | POA: Diagnosis not present

## 2024-06-04 DIAGNOSIS — I4819 Other persistent atrial fibrillation: Secondary | ICD-10-CM | POA: Diagnosis not present

## 2024-06-04 DIAGNOSIS — I10 Essential (primary) hypertension: Secondary | ICD-10-CM | POA: Insufficient documentation

## 2024-06-04 DIAGNOSIS — Z87891 Personal history of nicotine dependence: Secondary | ICD-10-CM | POA: Diagnosis not present

## 2024-06-04 DIAGNOSIS — Z01818 Encounter for other preprocedural examination: Secondary | ICD-10-CM

## 2024-06-04 DIAGNOSIS — E785 Hyperlipidemia, unspecified: Secondary | ICD-10-CM | POA: Insufficient documentation

## 2024-06-04 DIAGNOSIS — Z79899 Other long term (current) drug therapy: Secondary | ICD-10-CM | POA: Diagnosis not present

## 2024-06-04 DIAGNOSIS — I4811 Longstanding persistent atrial fibrillation: Secondary | ICD-10-CM | POA: Diagnosis not present

## 2024-06-04 DIAGNOSIS — I4891 Unspecified atrial fibrillation: Secondary | ICD-10-CM | POA: Diagnosis not present

## 2024-06-04 DIAGNOSIS — I2510 Atherosclerotic heart disease of native coronary artery without angina pectoris: Secondary | ICD-10-CM | POA: Diagnosis not present

## 2024-06-04 HISTORY — PX: CARDIOVERSION: EP1203

## 2024-06-04 MED ORDER — SODIUM CHLORIDE 0.9 % IV SOLN: INTRAVENOUS | Status: DC

## 2024-06-04 MED ORDER — LIDOCAINE 2% (20 MG/ML) 5 ML SYRINGE
INTRAMUSCULAR | Status: DC | PRN
  Administered 2024-06-04: 100 mg via INTRAVENOUS

## 2024-06-04 MED ORDER — PROPOFOL 10 MG/ML IV BOLUS
INTRAVENOUS | Status: DC | PRN
  Administered 2024-06-04: 50 mg via INTRAVENOUS

## 2024-06-04 NOTE — H&P (Signed)
 History and Physical Interval Note:    Peter Robinson is a 69 y.o. male has presented today for surgery, with the diagnosis of Atrial Fibrillation  The various methods of treatment have been discussed with the patient and family. After consideration of risks, benefits and other options for treatment, the patient has consented to  Procedure(s): CARDIOVERSION (N/A) as a surgical intervention .  The patient's history has been reviewed, patient examined, no change in status, stable for surgery.  I have reviewed the patient's chart and labs.  Questions were answered to the patient's satisfaction.     Venba Zenner C. Raford, MD, [facility]  06/04/2024 8:29 AM  No changes since clinic note dated 04/29/24   Electrophysiology Office Note:   Date:  06/04/2024  ID:  Peter Robinson, DOB Nov 20, 1955, MRN [id number]  Primary Cardiologist: None Electrophysiologist: None      History of Present Illness:   Peter Robinson is a 69 y.o. male with h/o DM2, HTN, HLD, non-obstructive CAD, stroke and atrial fibrillation who is being seen today for evaluation for Watchman device at the request of Dr. Rosemarie.  Discussed the use of AI scribe software for clinical note transcription with the patient, who gave verbal consent to proceed.  History of Present Illness Peter Robinson is a 69 year old male with atrial fibrillation and history of stroke who presents for evaluation of his cardiac condition. He was referred by Dr. Rosemarie, a neurologist, for evaluation of his atrial fibrillation and stroke risk.  He has a history of atrial fibrillation for several years with minimal symptoms. He has not undergone any procedures or been on antiarrhythmic medications due to minimal symptoms.  He has a history of stroke, with the most recent event occurring while on Eliquis , which he had been taking compliantly since October 24th. Prior to this, there was a period of noncompliance with Eliquis , which he resumed after his first stroke. He is now on  Pradaxa , started on a Tuesday night, and has been fully compliant since then. No bleeding issues have been reported while on blood thinners.  His past medical history includes a previously noted mitral valve issue. He reports that he was told his mitral valve was an issue in the past, but he is unsure if this was ever significant.  He was seen by a cardiologist in [facility], Dr. Debby Manner, about a year ago, who suggested addressing his atrial fibrillation, but no action was taken at that time. He expresses some frustration about past management, noting he was told he would be in permanent atrial fibrillation.  He is currently taking Pradaxa  and has been compliant with his medication regimen since his last stroke. No shortness of breath, bleeding issues, or other negative symptoms have been experienced while on his current medication.    Review of systems complete and found to be negative unless listed in HPI.   EP Information / Studies Reviewed:    EKG is not ordered today. EKG from 04/26/24 reviewed which showed AF       Echo 03/19/24:   1. Left ventricular ejection fraction, by estimation, is 50 to 55%. The  left ventricle has low normal function. The left ventricle has no regional  wall motion abnormalities. Left ventricular diastolic function could not  be evaluated.   2. Right ventricular systolic function is normal. The right ventricular  size is normal.   3. Left atrial size was mildly dilated.   4. The mitral valve is normal in structure. Trivial mitral valve  regurgitation. No evidence of mitral stenosis.   5. The aortic valve is tricuspid. There is mild calcification of the  aortic valve. Aortic valve regurgitation is not visualized. Aortic valve  sclerosis/calcification is present, without any evidence of aortic  stenosis.   6. The inferior vena cava is normal in size with greater than 50%  respiratory variability, suggesting right atrial pressure of 3 mmHg.    Risk  Assessment/Calculations:    CHA2DS2-VASc Score = 5   This indicates a 7.2% annual risk of stroke. The patient's score is based upon: CHF History: 0 HTN History: 1 Diabetes History: 1 Stroke History: 2 Vascular Disease History: 0 Age Score: 1 Gender Score: 0         Physical Exam:   VS:  BP (!) 179/106   Pulse 78   Temp 97.7 F (36.5 C) (Tympanic)   Resp 14   Ht 6' (1.829 m)   Wt 92.1 kg   SpO2 97%   BMI 27.53 kg/m    Wt Readings from Last 3 Encounters:  06/04/24 92.1 kg  05/18/24 95.5 kg  04/29/24 93.4 kg     General: Well developed, in no acute distress.  Neck: No JVD.  Cardiac: Normal rate, irregular rhythm.  Resp: Normal work of breathing.  Ext: No edema.  Neuro: No gross focal deficits.  Psych: Normal affect.    ASSESSMENT AND PLAN:    #Longstanding persistent atrial fibrillation: Patient states that cardioversion had previously been discussed but was never attempted.  Has been in AF for quite some time.  We discussed that progression to permanent atrial fibrillation correlates with increased risk of stroke, which patient has already had, development of dementia and potential heart failure.  Patient would like to try restoration of sinus rhythm.  We discussed that if we were able to restore sinus rhythm then performing catheter ablation would be reasonable. - Patient recently transition to Pradaxa  after having been on Eliquis .  Will have him take Pradaxa  for 3 weeks then start oral amiodarone  load -400 mg twice daily for 2 weeks, then 200 mg twice daily for 2 weeks then 200 mg once daily.  He will undergo cardioversion after having been on amiodarone  for approximately 2 weeks.  I will then see him back in clinic approximately 2 weeks after cardioversion where we will discuss long-term rhythm control options.  Patient is interested in catheter ablation and if we pursue catheter ablation then I believe concomitant watchman implantation would be appropriate given his  history of stroke despite oral anticoagulation.  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 Rosemarie for consideration of Watchman device.  He had a stroke initially in setting of noncompliance with Eliquis  while in AF.  He was started on Eliquis  and had no missed doses for nearly 1 month then presented with a second stroke.  This was deemed Eliquis  failure.  Given stroke despite DOAC, he is being recommended for a Watchman device by his neurologist.  He will continue Pradaxa  for now.  I have seen Peter Robinson in the office today who is being considered for a Watchman left atrial appendage closure device. I believe they will benefit from this procedure given their history of atrial fibrillation, CHA2DS2-VASc score of 5 and unadjusted ischemic stroke rate of 7.2% per year. 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 The patient's chart has been reviewed and I feel that they would be  a candidate for short term oral anticoagulation after Watchman implant.   It is my belief that after undergoing a LAA closure procedure, Peter Robinson will not need long term anticoagulation which eliminates anticoagulation side effects and major bleeding risk.   Procedural risks for the Watchman implant have been reviewed with the patient including a 0.5% risk of stroke, <1% risk of perforation and <1% risk of device embolization. Other risks include bleeding, vascular damage, tamponade, worsening renal function, and death. The patient understands these risk and wishes to proceed.     The published clinical data on the safety and effectiveness of WATCHMAN include but are not limited to the following: - Holmes DR, Jess BEARD, Sick P et al. for the PROTECT AF Investigators. Percutaneous closure of the left atrial appendage versus warfarin therapy for prevention of stroke in patients with atrial fibrillation: a randomised  non-inferiority trial. Lancet 2009; 374: 534-42. GLENWOOD Jess BEARD, Doshi SK, Jonita VEAR Satchel D et al. on behalf of the PROTECT AF Investigators. Percutaneous Left Atrial Appendage Closure for Stroke Prophylaxis in Patients With Atrial Fibrillation 2.3-Year Follow-up of the PROTECT AF (Watchman Left Atrial Appendage System for Embolic Protection in Patients With Atrial Fibrillation) Trial. Circulation 2013; 127:720-729. - Alli O, Doshi S,  Kar S, Reddy VY, Sievert H et al. Quality of Life Assessment in the Randomized PROTECT AF (Percutaneous Closure of the Left Atrial Appendage Versus Warfarin Therapy for Prevention of Stroke in Patients With Atrial Fibrillation) Trial of Patients at Risk for Stroke With Nonvalvular Atrial Fibrillation. J Am Coll Cardiol 2013; 61:1790-8. GLENWOOD Satchel DR, Archer RAMAN, Price M, Whisenant B, Sievert H, Doshi S, Huber K, Reddy V. Prospective randomized evaluation of the Watchman left atrial appendage Device in patients with atrial fibrillation versus long-term warfarin therapy; the PREVAIL trial. Journal of the [employer] of Cardiology, Vol. 4, No. 1, 2014, 1-11. - Kar S, Doshi SK, Sadhu A, Horton R, Osorio J et al. Primary outcome evaluation of a next-generation left atrial appendage closure device: results from the PINNACLE FLX trial. Circulation 2021;143(18)1754-1762.    After today's visit with the patient which was dedicated solely for shared decision making visit regarding LAA closure device, the patient decided to proceed with the LAA appendage closure procedure scheduled to be done in the near future at [facility]. Prior to the procedure, I would like to obtain a gated CT scan of the chest with contrast timed for PV/LA visualization.   HAS-BLED score 3 Hypertension Yes  Abnormal renal and liver function (Dialysis, transplant, Cr >2.26 mg/dL /Cirrhosis or Bilirubin >2x Normal or AST/ALT/AP >3x Normal) No  Stroke Yes  Bleeding No  Labile INR (Unstable/high INR) No   Elderly (>65) Yes  Drugs or alcohol (>= 8 drinks/week, anti-plt or NSAID) No   CHA2DS2-VASc Score = 5  The patient's score is based upon: CHF History: 0 HTN History: 1 Diabetes History: 1 Stroke History: 2 Vascular Disease History: 0 Age Score: 1 Gender Score: 0

## 2024-06-04 NOTE — Transfer of Care (Signed)
 Immediate Anesthesia Transfer of Care Note  Patient: Peter Robinson  Procedure(s) Performed: CARDIOVERSION  Patient Location: PACU and Cath Lab  Anesthesia Type:General  Level of Consciousness: drowsy  Airway & Oxygen Therapy: Patient Spontanous Breathing and Patient connected to nasal cannula oxygen  Post-op Assessment: Report given to RN and Post -op Vital signs reviewed and stable  Post vital signs: Reviewed and stable  Last Vitals:  Vitals Value Taken Time  BP 145/98   Temp    Pulse 71   Resp 10   SpO2 95     Last Pain:  Vitals:   06/04/24 0715  TempSrc: Tympanic  PainSc: 5          Complications: No notable events documented.

## 2024-06-04 NOTE — Discharge Instructions (Signed)

## 2024-06-04 NOTE — Anesthesia Preprocedure Evaluation (Signed)
"                                    Anesthesia Evaluation  Patient identified by MRN, date of birth, ID band Patient awake    Reviewed: Allergy & Precautions, H&P , NPO status , Patient's Chart, lab work & pertinent test results  History of Anesthesia Complications Negative for: history of anesthetic complications  Airway Mallampati: II  TM Distance: >3 FB Neck ROM: Full    Dental no notable dental hx.    Pulmonary neg pulmonary ROS, former smoker   Pulmonary exam normal breath sounds clear to auscultation       Cardiovascular hypertension, (-) Past MI Normal cardiovascular exam+ dysrhythmias Atrial Fibrillation  Rhythm:Regular Rate:Normal     Neuro/Psych CVA  negative psych ROS   GI/Hepatic negative GI ROS, Neg liver ROS,,,  Endo/Other  negative endocrine ROSdiabetes, Type 2    Renal/GU negative Renal ROS  negative genitourinary   Musculoskeletal negative musculoskeletal ROS (+)    Abdominal   Peds negative pediatric ROS (+)  Hematology negative hematology ROS (+)   Anesthesia Other Findings   Reproductive/Obstetrics negative OB ROS                              Anesthesia Physical Anesthesia Plan  ASA: 3  Anesthesia Plan: General   Post-op Pain Management:    Induction: Intravenous  PONV Risk Score and Plan: 2 and Treatment may vary due to age or medical condition  Airway Management Planned: Natural Airway and Nasal Cannula  Additional Equipment:   Intra-op Plan:   Post-operative Plan:   Informed Consent: I have reviewed the patients History and Physical, chart, labs and discussed the procedure including the risks, benefits and alternatives for the proposed anesthesia with the patient or authorized representative who has indicated his/her understanding and acceptance.     Dental advisory given  Plan Discussed with: CRNA  Anesthesia Plan Comments:          Anesthesia Quick Evaluation  "

## 2024-06-04 NOTE — Anesthesia Postprocedure Evaluation (Signed)
"   Anesthesia Post Note  Patient: Peter Robinson  Procedure(s) Performed: CARDIOVERSION     Patient location during evaluation: PACU Anesthesia Type: General Level of consciousness: awake and alert Pain management: pain level controlled Vital Signs Assessment: post-procedure vital signs reviewed and stable Respiratory status: spontaneous breathing, nonlabored ventilation, respiratory function stable and patient connected to nasal cannula oxygen Cardiovascular status: blood pressure returned to baseline and stable Postop Assessment: no apparent nausea or vomiting Anesthetic complications: no   No notable events documented.  Last Vitals:  Vitals:   06/04/24 0915 06/04/24 0925  BP: (!) 142/96 (!) 141/87  Pulse: 60 (!) 57  Resp: 13 13  Temp:    SpO2: 95% 95%    Last Pain:  Vitals:   06/04/24 0925  TempSrc:   PainSc: 0-No pain                 Thom JONELLE Peoples      "

## 2024-06-04 NOTE — CV Procedure (Signed)
 Electrical Cardioversion Procedure Note Peter Robinson [id number] 08-Sep-1955  Procedure: Electrical Cardioversion Indications:  Atrial Fibrillation  Procedure Details Consent: Risks of procedure as well as the alternatives and risks of each were explained to the (patient/caregiver).  Consent for procedure obtained. Time Out: Verified patient identification, verified procedure, site/side was marked, verified correct patient position, special equipment/implants available, medications/allergies/relevent history reviewed, required imaging and test results available.  Performed  Patient placed on cardiac monitor, pulse oximetry, supplemental oxygen as necessary.  Sedation given: propofol  Pacer pads placed anterior and posterior chest.  Cardioverted 1 time(s).  Cardioverted at 200J.  Evaluation Findings: Post procedure EKG shows: NSR Complications: None Patient did tolerate procedure well.   [name], MD 06/04/2024, 9:05 AM

## 2024-06-05 ENCOUNTER — Encounter (HOSPITAL_COMMUNITY): Payer: Self-pay | Admitting: Cardiovascular Disease

## 2024-06-24 ENCOUNTER — Ambulatory Visit: Admitting: Cardiology

## 2024-06-24 NOTE — Progress Notes (Addendum)
 " Electrophysiology Office Note:   Date:  06/25/2024  ID:  Peter Robinson, DOB [birth year], MRN [id number]  Primary Cardiologist: None Electrophysiologist: Fonda Kitty, MD      History of Present Illness:   Peter Robinson is a 69 y.o. male with h/o DM2, HTN, HLD, non-obstructive CAD, stroke and atrial fibrillation who is being seen today for evaluation for Watchman device at the request of Dr. Rosemarie.  Discussed the use of AI scribe software for clinical note transcription with the patient, who gave verbal consent to proceed.  History of Present Illness Peter Robinson is a 69 year old male with atrial fibrillation and hypertension who presents for evaluation of blood pressure management and atrial fibrillation treatment options. He was initially referred by another doctor to discuss the possibility of a Watchman device due to his history of stroke while on blood thinners.  His blood pressure has been significantly elevated since his heart rhythm returned to normal, with readings often around 190/90 mmHg, particularly after work. This discourages him from exercising. He is currently taking 5 mg of amlodipine , 25 mg of carvedilol  (12.5 mg twice daily), and doxazosin. Despite doubling his blood pressure medications, his readings remain high. He acknowledges not following the recommended procedure for taking blood pressure measurements.  He has a history of atrial fibrillation. He is currently in normal rhythm after starting amiodarone  and undergoing a cardioversion. He denies any significant changes in symptoms.  He is currently on Pradaxa  for stroke prevention.  He experiences daily debilitating dizzy spells, which he believes may be related to his blood pressure issues. The dizziness is significant and impacts his daily life. He has a constant headache and ringing in the ears, which he associates with his elevated blood pressure. He wants these symptoms to resolve.    Review of systems complete and  found to be negative unless listed in HPI.   EP Information / Studies Reviewed:    EKG is ordered today. Personal review as below.  EKG Interpretation Date/Time:  Friday June 25 2024 09:12:23 EST Ventricular Rate:  58 PR Interval:  176 QRS Duration:  98 QT Interval:  456 QTC Calculation: 447 R Axis:   83  Text Interpretation: Sinus bradycardia When compared with ECG of 04-Jun-2024 09:05, Premature ventricular complexes are no longer Present Questionable change in QRS axis Nonspecific T wave abnormality no longer evident in Anterior leads Confirmed by Kitty Fonda [phone]) on 06/25/2024 9:35:42 AM   ECG 04/26/24: AF   Echo 03/19/24:   1. Left ventricular ejection fraction, by estimation, is 50 to 55%. The  left ventricle has low normal function. The left ventricle has no regional  wall motion abnormalities. Left ventricular diastolic function could not  be evaluated.   2. Right ventricular systolic function is normal. The right ventricular  size is normal.   3. Left atrial size was mildly dilated.   4. The mitral valve is normal in structure. Trivial mitral valve  regurgitation. No evidence of mitral stenosis.   5. The aortic valve is tricuspid. There is mild calcification of the  aortic valve. Aortic valve regurgitation is not visualized. Aortic valve  sclerosis/calcification is present, without any evidence of aortic  stenosis.   6. The inferior vena cava is normal in size with greater than 50%  respiratory variability, suggesting right atrial pressure of 3 mmHg.    Risk Assessment/Calculations:    CHA2DS2-VASc Score = 5   This indicates a 7.2% annual risk of stroke. The patient's score is based  upon: CHF History: 0 HTN History: 1 Diabetes History: 1 Stroke History: 2 Vascular Disease History: 0 Age Score: 1 Gender Score: 0         Physical Exam:   VS:  BP (!) 144/88 (BP Location: Left Arm, Patient Position: Sitting, Cuff Size: Large)   Pulse (!) 58   Ht 6'  (1.829 m)   Wt 205 lb (93 kg)   SpO2 96%   BMI 27.80 kg/m    Wt Readings from Last 3 Encounters:  06/25/24 205 lb (93 kg)  06/04/24 203 lb (92.1 kg)  05/18/24 210 lb 9.6 oz (95.5 kg)     General: Well developed, in no acute distress.  Neck: No JVD.  Cardiac: Bradycardic, regular rhythm.  Resp: Normal work of breathing.  Ext: No edema.  Neuro: No gross focal deficits.  Psych: Normal affect.    ASSESSMENT AND PLAN:    #Longstanding persistent atrial fibrillation:  #High risk medication use: Amiodarone . -He has been able to maintain sinus since his last cardioversion while on amiodarone , which is encouraging. He particularly values maintenance of sinus rhythm due to its reduction in his stroke risk. He would like to avoid long-term side effects associated with amiodarone . For this reason, we have discussed catheter ablation as a replacement for amiodarone . Discussed risks, recovery and likelihood of success with each treatment strategy. Risk, benefits, and alternatives to EP study and ablation for afib were discussed. These risks include but are not limited to stroke, bleeding, vascular damage, tamponade, perforation, damage to the esophagus, lungs, phrenic nerve and other structures, pulmonary vein stenosis, worsening renal function, coronary vasospasm and death.  Discussed potential need for repeat ablation procedures and antiarrhythmic drugs after an initial ablation. The patient understands these risk and wishes to proceed.  We will therefore proceed with catheter ablation at the next available time.  Carto, ICE, anesthesia are requested for the procedure.   We will obtain CT PV protocol prior to the procedure. - Continue amiodarone  as bridge to ablation.  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 Rosemarie for consideration of Watchman device.  He had a stroke initially in setting of noncompliance with Eliquis  while in AF.  He was started on  Eliquis  and had no missed doses for nearly 1 month then presented with a second stroke.  This was deemed Eliquis  failure.  Given stroke despite DOAC, he is being recommended for a Watchman device by his neurologist.  He will continue Pradaxa  for now.  I have seen Peter Robinson in the office today who is being considered for a Watchman left atrial appendage closure device. I believe they will benefit from this procedure given their history of atrial fibrillation, CHA2DS2-VASc score of 5 and unadjusted ischemic stroke rate of 7.2% per year. 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 The patient's chart has been reviewed and I feel that they would be a candidate for short term oral anticoagulation after Watchman implant.   It is my belief that after undergoing a LAA closure procedure, Peter Robinson will not need long term anticoagulation which eliminates anticoagulation side effects and major bleeding risk.   Procedural risks for the Watchman implant have been reviewed with the patient including a 0.5% risk of stroke, <1% risk of perforation and <1% risk of device embolization. Other risks include bleeding, vascular damage, tamponade, worsening renal function, and death. The patient understands these risk and wishes  to proceed.     The published clinical data on the safety and effectiveness of WATCHMAN include but are not limited to the following: - Holmes DR, Jess BEARD, Sick P et al. for the PROTECT AF Investigators. Percutaneous closure of the left atrial appendage versus warfarin therapy for prevention of stroke in patients with atrial fibrillation: a randomised non-inferiority trial. Lancet 2009; 374: 534-42. GLENWOOD Jess BEARD, Doshi SK, Jonita VEAR Satchel D et al. on behalf of the PROTECT AF Investigators. Percutaneous Left Atrial Appendage Closure for Stroke Prophylaxis in Patients With Atrial Fibrillation 2.3-Year Follow-up of the PROTECT AF  (Watchman Left Atrial Appendage System for Embolic Protection in Patients With Atrial Fibrillation) Trial. Circulation 2013; 127:720-729. - Alli O, Doshi S,  Kar S, Reddy VY, Sievert H et al. Quality of Life Assessment in the Randomized PROTECT AF (Percutaneous Closure of the Left Atrial Appendage Versus Warfarin Therapy for Prevention of Stroke in Patients With Atrial Fibrillation) Trial of Patients at Risk for Stroke With Nonvalvular Atrial Fibrillation. J Am Coll Cardiol 2013; 61:1790-8. GLENWOOD Satchel DR, Archer RAMAN, Price M, Whisenant B, Sievert H, Doshi S, Huber K, Reddy V. Prospective randomized evaluation of the Watchman left atrial appendage Device in patients with atrial fibrillation versus long-term warfarin therapy; the PREVAIL trial. Journal of the [employer] of Cardiology, Vol. 4, No. 1, 2014, 1-11. - Kar S, Doshi SK, Sadhu A, Horton R, Osorio J et al. Primary outcome evaluation of a next-generation left atrial appendage closure device: results from the PINNACLE FLX trial. Circulation 2021;143(18)1754-1762.   HAS-BLED score 3 Hypertension Yes  Abnormal renal and liver function (Dialysis, transplant, Cr >2.26 mg/dL /Cirrhosis or Bilirubin >2x Normal or AST/ALT/AP >3x Normal) No  Stroke Yes  Bleeding No  Labile INR (Unstable/high INR) No  Elderly (>65) Yes  Drugs or alcohol (>= 8 drinks/week, anti-plt or NSAID) No   CHA2DS2-VASc Score = 5  The patient's score is based upon: CHF History: 0 HTN History: 1 Diabetes History: 1 Stroke History: 2 Vascular Disease History: 0 Age Score: 1 Gender Score: 0     #Hypertension -Above goal today. Some high readings at home. Increase amlodipine  to 10mg  daily. If BP remains elevated, then would recommend either changing losartan  to an ACEI or starting chlorthalidone or spironolactone, but defer to PCP.  Recommend checking blood pressures 1-2 times per week at home and recording the values.  Recommend bringing these recordings to the primary care  physician for final decision. He is also requesting referral to Dr. Raford, whom he met during his cardioversion. I will place referral.    After today's visit, the patient decided to proceed with concomitant AF ablation and LAA appendage closure. Prior to the procedure, I would like to obtain a gated CT scan of the chest with contrast timed for PV/LA visualization.    Total time of encounter: 50 minutes total time of encounter, including face-to-face patient care, coordination of care and counseling regarding high complexity medical decision making re: AF and stroke.  Signed, Fonda Kitty, MD  "

## 2024-06-25 ENCOUNTER — Telehealth: Payer: Self-pay

## 2024-06-25 ENCOUNTER — Ambulatory Visit: Attending: Cardiology | Admitting: Cardiology

## 2024-06-25 ENCOUNTER — Encounter: Payer: Self-pay | Admitting: Cardiology

## 2024-06-25 VITALS — BP 144/88 | HR 58 | Ht 72.0 in | Wt 205.0 lb

## 2024-06-25 DIAGNOSIS — D6869 Other thrombophilia: Secondary | ICD-10-CM

## 2024-06-25 DIAGNOSIS — Z8673 Personal history of transient ischemic attack (TIA), and cerebral infarction without residual deficits: Secondary | ICD-10-CM | POA: Diagnosis not present

## 2024-06-25 DIAGNOSIS — Z79899 Other long term (current) drug therapy: Secondary | ICD-10-CM | POA: Diagnosis not present

## 2024-06-25 DIAGNOSIS — I10 Essential (primary) hypertension: Secondary | ICD-10-CM

## 2024-06-25 DIAGNOSIS — I4821 Permanent atrial fibrillation: Secondary | ICD-10-CM

## 2024-06-25 DIAGNOSIS — I4811 Longstanding persistent atrial fibrillation: Secondary | ICD-10-CM | POA: Diagnosis not present

## 2024-06-25 MED ORDER — AMLODIPINE BESYLATE 10 MG PO TABS: 10.0000 mg | ORAL_TABLET | Freq: Every day | ORAL | 3 refills | Status: AC

## 2024-06-25 MED ORDER — AMIODARONE HCL 200 MG PO TABS: 200.0000 mg | ORAL_TABLET | Freq: Every day | ORAL | 3 refills | Status: AC

## 2024-06-25 MED ORDER — DABIGATRAN ETEXILATE MESYLATE 150 MG PO CAPS: 150.0000 mg | ORAL_CAPSULE | Freq: Two times a day (BID) | ORAL | 3 refills | Status: AC

## 2024-06-25 NOTE — Telephone Encounter (Signed)
 Cindy from Dr. Yvetta office left a message requesting return call. Attempted to call her back. Had to leave a message.

## 2024-06-25 NOTE — Patient Instructions (Signed)
 Medication Instructions:  Your physician has recommended you make the following change in your medication:  1) INCREASE amlodipine  to 10 mg once daily  *If you need a refill on your cardiac medications before your next appointment, please call your pharmacy*   Testing/Procedures: Cardiac CT Your physician has requested that you have cardiac CT. Cardiac computed tomography (CT) is a painless test that uses an x-ray machine to take clear, detailed pictures of your heart. For further information please visit [email or web address] Please follow instruction sheet as given. You will be called to schedule this test.  Ablation Your physician has recommended that you have an ablation. Catheter ablation is a medical procedure used to treat some cardiac arrhythmias (irregular heartbeats). During catheter ablation, a long, thin, flexible tube is put into a blood vessel in your groin (upper thigh), or neck. This tube is called an ablation catheter. It is then guided to your heart through the blood vessel. Radio frequency waves destroy small areas of heart tissue where abnormal heartbeats may cause an arrhythmia to start.  Watchman  Your physician has requested that you have Left atrial appendage (LAA) closure device implantation is a procedure to put a small device in the LAA of the heart. The LAA is a small sac in the wall of the heart's left upper chamber. Blood clots can form in this area. The device, Watchman closes the LAA to help prevent a blood clot and stroke.  You will be contacted by Nurse Navigator, Danielle to schedule your pre-procedure visit and procedure date. If you have any questions she can be reached at [phone].   Follow-Up: At [facility], you and your health needs are our priority.  As part of our continuing mission to provide you with exceptional heart care, our providers are all part of one team.  This team includes your primary Cardiologist (physician) and Advanced  Practice Providers or APPs (Physician Assistants and Nurse Practitioners) who all work together to provide you with the care you need, when you need it.  You have been referred to Dr. [name]

## 2024-06-25 NOTE — Telephone Encounter (Signed)
 Per Dr. Kennyth, proceed with concomitant ablation/watchman. Patient will need CT prior.   Called Dr. Yvetta office. Spoke with [employer]. She stated Amy, Dr. Yvetta nurse, was not in the office. She took a message. Asked if he would be willing to sign Watchman Referral form. Requested call back.

## 2024-06-28 NOTE — Telephone Encounter (Signed)
 Patient left message returning call. Attempted to call patient back x3 but call went to voicemail every ring.

## 2024-06-28 NOTE — Telephone Encounter (Signed)
 Left voicemail for patient to return call to arrange CT scan.

## 2024-06-29 NOTE — Telephone Encounter (Signed)
 Patient returned call.   Confirmed with patient that he is interested in pursing concomitant ablation/watchman procedure. Explained prior that Dr. Kennyth would like a CT scan. Arranged CT for 07/15/24. Letter will be sent via MyChart. Patient reports having recent labs drawn by PCP. Provided patient with ablation and watchman CPT codes to call insurance for pricing information.  Called PCP office. Patient had labs drawn today (including CMP). Requested they fax results.

## 2024-06-30 LAB — LAB REPORT - SCANNED
A1c: 7.5
Albumin, Urine POC: 304.2
Creatinine, POC: 194.2 mg/dL
EGFR: 45
Microalb Creat Ratio: 157
TSH: 4.79 (ref 0.41–5.90)

## 2024-07-01 ENCOUNTER — Encounter: Payer: Self-pay | Admitting: Family Medicine

## 2024-07-01 NOTE — Telephone Encounter (Signed)
 Error

## 2024-07-02 ENCOUNTER — Telehealth: Payer: Self-pay

## 2024-07-02 NOTE — Telephone Encounter (Signed)
 Referral form and letter faxed to Dr. Yvetta office at [phone].

## 2024-07-02 NOTE — Telephone Encounter (Signed)
 Patient called in asking if he needed repeat labs. Advised did receive labs from PCP office therefore repeat labs not needed. Call did disconnect. Attempted to call patient back but unable to leave message.

## 2024-07-02 NOTE — Telephone Encounter (Signed)
 Peter Robinson

## 2024-07-02 NOTE — Telephone Encounter (Signed)
 Patient returned call. Confirmed he had no questions. He was grateful for assistance.

## 2024-07-15 ENCOUNTER — Ambulatory Visit (HOSPITAL_COMMUNITY)

## 2024-09-08 ENCOUNTER — Institutional Professional Consult (permissible substitution) (HOSPITAL_BASED_OUTPATIENT_CLINIC_OR_DEPARTMENT_OTHER): Admitting: Cardiovascular Disease
